# Patient Record
Sex: Female | Born: 1965 | Race: White | Hispanic: No | Marital: Married | Smoking: Current every day smoker
Health system: Southern US, Community
[De-identification: ages and names within clinical notes are randomized; demographics above are authoritative.]

## PROBLEM LIST (undated history)

## (undated) DIAGNOSIS — R002 Palpitations: Secondary | ICD-10-CM

## (undated) DIAGNOSIS — F430 Acute stress reaction: Secondary | ICD-10-CM

## (undated) DIAGNOSIS — Z8619 Personal history of other infectious and parasitic diseases: Secondary | ICD-10-CM

## (undated) DIAGNOSIS — R0989 Other specified symptoms and signs involving the circulatory and respiratory systems: Secondary | ICD-10-CM

## (undated) DIAGNOSIS — I10 Essential (primary) hypertension: Secondary | ICD-10-CM

## (undated) HISTORY — DX: Palpitations: R00.2

## (undated) HISTORY — PX: APPENDECTOMY: SHX54

## (undated) HISTORY — DX: Personal history of other infectious and parasitic diseases: Z86.19

## (undated) HISTORY — DX: Other specified symptoms and signs involving the circulatory and respiratory systems: R09.89

## (undated) HISTORY — DX: Acute stress reaction: F43.0

## (undated) HISTORY — PX: TONSILLECTOMY: SUR1361

## (undated) HISTORY — DX: Essential (primary) hypertension: I10

---

## 2005-03-20 ENCOUNTER — Other Ambulatory Visit: Admission: RE | Admit: 2005-03-20 | Discharge: 2005-03-20 | Payer: Self-pay | Admitting: Obstetrics and Gynecology

## 2008-11-26 ENCOUNTER — Emergency Department (HOSPITAL_COMMUNITY): Admission: EM | Admit: 2008-11-26 | Discharge: 2008-11-27 | Payer: Self-pay | Admitting: Emergency Medicine

## 2010-10-17 LAB — BASIC METABOLIC PANEL
BUN: 6 mg/dL (ref 6–23)
Creatinine, Ser: 0.63 mg/dL (ref 0.4–1.2)
GFR calc non Af Amer: >60 mL/min (ref >60)

## 2010-10-17 LAB — URINALYSIS, ROUTINE W REFLEX MICROSCOPIC
Glucose, UA: NEGATIVE mg/dL
Nitrite: NEGATIVE
Protein, ur: NEGATIVE mg/dL

## 2010-10-17 LAB — DIFFERENTIAL
Eosinophils Absolute: 0.1 10*3/uL (ref 0.0–0.7)
Lymphocytes Relative: 23 % (ref 12–46)
Lymphs Abs: 1.6 10*3/uL (ref 0.7–4.0)
Neutrophils Relative %: 68 % (ref 43–77)

## 2010-10-17 LAB — CBC
Platelets: 214 10*3/uL (ref 150–400)
WBC: 7.1 10*3/uL (ref 4.0–10.5)

## 2010-10-17 LAB — POCT CARDIAC MARKERS
Troponin i, poc: <0.05 ng/mL (ref 0.00–0.09)
Troponin i, poc: <0.05 ng/mL (ref 0.00–0.09)

## 2010-11-21 NOTE — Consult Note (Signed)
NAMEALECEA, TREGO NO.:  [id number]   MEDICAL RECORD NO.:  [id number]          PATIENT TYPE:  EMS   LOCATION:  [facility]                         FACILITY:  [facility]   PHYSICIAN:  Vania Rea, M.D. DATE OF BIRTH:  03/14/1966   DATE OF CONSULTATION:  11/27/2008  DATE OF DISCHARGE:                                 CONSULTATION   PRIMARY CARE PHYSICIAN:  Unassigned.   REQUESTING PHYSICIAN:  Lorre Nick, M.D., ER physician.   REASON FOR CONSULTATION:  Chest pain.   IMPRESSION:  1. Atypical chest pain, aggravated by rest, relieved with activity.      Not aggravated by excessive work in the garden.  2. Tobacco abuse.   RECOMMENDATIONS:  1. Follow up with cardiology service on Monday.  Avoid extreme      exertion until then.  2. Protonix daily p.r.n.  3. Discontinue tobacco use.   HISTORY OF PRESENT ILLNESS:  This is a 45 year old Caucasian lady who  denies any significant past medical history, who reports that for the  past 2 days, she has been having a tight pain around her chest, which is  crushing and unremitting, associated with occasional palpitations and  shortness of breath.  No diaphoresis.  She says the pain seems to lessen  when she gets involved in vigorous physical activity and is aggravated  by resting.  She does a lot of digging in the garden, and that seems to  relieve the pain.  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  The pain is  also associated with tingling in both arms.  Today was associated with  shooting pain going up into her neck, and she decided to call EMS.  They  advised her to take aspirin, and when they arrived, they gave her  sublingual nitroglycerin, which she said relieved the pain and  discomfort somewhat, but it is still present.   In the emergency room, the patient has had 2 sets of cardiac enzymes  drawn, and despite the 2 days of pain, cardiac enzymes  are completely  normal with a undetectable CK-MB and undetectable troponins.  Her EKG is  completely normal.  Patient is anxious to be discharged home.   PAST MEDICAL HISTORY:  Nil.   MEDICATIONS:  None.   ALLERGIES:  PENICILLIN, KEFLEX, and Z-PAK all cause swelling, itching  and hives.  She says there is another medication that causes a similar  problem, but she cannot remember the name of it.   SOCIAL HISTORY:  She lives at home with a 81-year-old son and her  husband.  She denies alcohol or illicit drug use.  She does smoke a half  pack per day.   FAMILY HISTORY:  Father had a heart attack in his late 90s.  Her  brother, who is a very heavy smoker and is also diabetic, had a heart  attack and is status post stenting twice.  She has another brother with  diabetes.   REVIEW OF SYSTEMS:  Other than noted above, a  10-point review of systems  is unremarkable.   PHYSICAL EXAMINATION:  A very pleasant and healthy looking Caucasian  lady sitting up in the stretcher in no acute distress.  VITALS:  Temperature 98.4, pulse 68, respirations 16, blood pressure  102/59.  She is saturating at 99% on room air.  Pupils are round and equal.  Mucous membranes are pink and anicteric.  She is not dehydrated.  No cervical lymphadenopathy or thyromegaly.  No  jugular venous distention.  CHEST:  Clear to auscultation bilaterally.  She has no reproducible cardiac tenderness.  CARDIOVASCULAR:  Regular rhythm.  No murmur heard.  ABDOMEN:  Soft and nontender.  No masses.  EXTREMITIES:  Without edema.  She has 2+ dorsalis pedis and posterior  tibial pulses bilaterally.  CENTRAL NERVOUS SYSTEM:  Cranial nerves II-XII are grossly intact.  She  has no focal neurologic deficit.   LABS:  CBC is reviewed and is completely normal with a hemoglobin of  13.7, platelets 214, white count 7.1.  Normal differential.  Her serum  chemistry is likewise completely normal with a creatinine of 3.8, BUN 6,  and  creatinine of 6.3, calcium 9.  Her cardiac enzymes on both sets, the  myoglobin in the 30s.  CK-MB and troponin are undetectable.   EKG shows a normal sinus rhythm with a rate of 68 and no ST segment  changes.   Chest x-ray shows hyperaerated lungs but normal cardiac size and shape.      Vania Rea, M.D.  Electronically Signed     LC/MEDQ  D:  11/27/2008  T:  11/27/2008  Job:  [id number]   cc:   [facility]

## 2011-11-29 ENCOUNTER — Encounter (HOSPITAL_COMMUNITY): Payer: Self-pay

## 2011-11-29 ENCOUNTER — Observation Stay (HOSPITAL_COMMUNITY)
Admission: EM | Admit: 2011-11-29 | Discharge: 2011-11-30 | Disposition: A | Discharge status: Home / Self Care | Payer: Self-pay | Attending: Emergency Medicine | Admitting: Emergency Medicine

## 2011-11-29 ENCOUNTER — Emergency Department (HOSPITAL_COMMUNITY): Payer: Self-pay

## 2011-11-29 DIAGNOSIS — R079 Chest pain, unspecified: Principal | ICD-10-CM | POA: Insufficient documentation

## 2011-11-29 DIAGNOSIS — R002 Palpitations: Secondary | ICD-10-CM | POA: Insufficient documentation

## 2011-11-29 DIAGNOSIS — R42 Dizziness and giddiness: Secondary | ICD-10-CM | POA: Insufficient documentation

## 2011-11-29 DIAGNOSIS — R0989 Other specified symptoms and signs involving the circulatory and respiratory systems: Secondary | ICD-10-CM | POA: Insufficient documentation

## 2011-11-29 DIAGNOSIS — R0609 Other forms of dyspnea: Secondary | ICD-10-CM | POA: Insufficient documentation

## 2011-11-29 LAB — BASIC METABOLIC PANEL
CO2: 22 mEq/L (ref 19–32)
Chloride: 105 mEq/L (ref 96–112)
Glucose, Bld: 94 mg/dL (ref 70–99)
Potassium: 3.9 mEq/L (ref 3.5–5.1)
Sodium: 139 mEq/L (ref 135–145)

## 2011-11-29 LAB — TROPONIN I
Troponin I: <0.3 ng/mL (ref <0.30)
Troponin I: <0.3 ng/mL (ref <0.30)

## 2011-11-29 LAB — RAPID URINE DRUG SCREEN, HOSP PERFORMED
Amphetamines: NOT DETECTED
Benzodiazepines: NOT DETECTED
Cocaine: NOT DETECTED
Opiates: NOT DETECTED

## 2011-11-29 LAB — CBC
Hemoglobin: 13.7 g/dL (ref 12.0–15.0)
MCH: 31.6 pg (ref 26.0–34.0)
RBC: 4.33 MIL/uL (ref 3.87–5.11)
WBC: 6.9 10*3/uL (ref 4.0–10.5)

## 2011-11-29 MED ORDER — MORPHINE SULFATE 4 MG/ML IJ SOLN
4.0000 mg | Freq: Once | INTRAMUSCULAR | Status: AC
  Administered 2011-11-29: 4 mg via INTRAVENOUS
  Filled 2011-11-29: qty 1

## 2011-11-29 MED ORDER — MORPHINE SULFATE 2 MG/ML IJ SOLN
2.0000 mg | Freq: Once | INTRAMUSCULAR | Status: AC
  Administered 2011-11-29: 2 mg via INTRAVENOUS
  Filled 2011-11-29: qty 1

## 2011-11-29 MED ORDER — NITROGLYCERIN 2 % TD OINT
1.0000 [in_us] | TOPICAL_OINTMENT | Freq: Once | TRANSDERMAL | Status: AC
  Administered 2011-11-29: 1 [in_us] via TOPICAL
  Filled 2011-11-29: qty 1

## 2011-11-29 MED ORDER — ASPIRIN 325 MG PO TABS
325.0000 mg | ORAL_TABLET | Freq: Once | ORAL | Status: AC
  Administered 2011-11-29: 325 mg via ORAL
  Filled 2011-11-29: qty 1

## 2011-11-29 MED ORDER — SODIUM CHLORIDE 0.9 % IV BOLUS (SEPSIS)
1000.0000 mL | Freq: Once | INTRAVENOUS | Status: AC
  Administered 2011-11-29: 1000 mL via INTRAVENOUS

## 2011-11-29 NOTE — ED Provider Notes (Signed)
I saw and evaluated the patient, reviewed the resident's note and I agree with the findings and plan.   Cyndra Numbers, MD 11/29/11 1640

## 2011-11-29 NOTE — ED Notes (Signed)
MD at bedside. 

## 2011-11-29 NOTE — ED Notes (Signed)
Regular Diet tray ordered to CDU 10

## 2011-11-29 NOTE — ED Notes (Addendum)
Patient states she was sitting in the car this morning and her heart started to race and she felt dizzy with SOB and a metal taste in her mouth. Patient states episodes like this x 6 months. This episode has been intermittent x one week. Patient placed on monitor and 2L oxygen with sats of 98%. EKG captured on arrival to [facility].

## 2011-11-29 NOTE — ED Provider Notes (Signed)
7:37 PM- Pt with on and off chest pain for 1 week with palpations who has a very significant family history of cardiac disease has been placed in the CDU for chest pain protocol to have a CT coronary angio done in the morning. I have discussed the patients chest xray, EKG and laboratory results with patient. I have also informed her what she can expect for tomorrow. She is still having intermittent  Discomfort but it is very mild. She is in NAD and has been told she can push the call buttons to ask questions anytime.  10:02 PM- patient is having some mild chest pain and pressure again at 2/10. No ekg changes noted on monitor. Morphine 4mg  ordered.  12:17 AM-  End of shift. Dr. Weldon Inches has been made aware that patient is on CPP and to have an Ct angio coronary in the morning.  Dorthula Matas, PA 11/30/11 [phone]

## 2011-11-29 NOTE — ED Provider Notes (Signed)
CDU Admission Note  Patient is a 46 year old female with strong family history for coronary artery disease who presented with chest pain today. Patient currently chest pain-free. Patient is criteria for observation today will be made to CDU protocol likely for coronary CTA.  CV: RRR, no m/r/g Pulm: CTAB, no c/w/r  Veronica Numbers, MD 11/29/11 1640

## 2011-11-29 NOTE — ED Notes (Signed)
Pt was brought in by ambulance with complain of on and off chest tightness with shortness of breath, dizziness radiating to the lt shoulder and lt jaw. Pt was given 4 baby ASA and 1 NTG SL.

## 2011-11-29 NOTE — ED Notes (Signed)
Per EDP patient may eat and drink after second troponin is drawn and results returned.

## 2011-11-29 NOTE — ED Provider Notes (Signed)
History     CSN: [id number]  Arrival date & time 11/29/11  1237   First MD Initiated Contact with Patient 11/29/11 1244      Chief Complaint  Patient presents with  . Chest Pain    (Consider location/radiation/quality/duration/timing/severity/associated sxs/prior treatment) HPI Comments: Intermittent palpitations, lightheadedness, chest pressure, dyspnea today.  Worse with exertion.  Now feeling better.  Has had similar symptoms in the past but not recently.  Good PO intake and otherwise doing well.   Patient is a 46 y.o. female presenting with chest pain. The history is provided by the patient.  Chest Pain The chest pain began less than 1 hour ago. Duration of episode(s) is 4 hours. Chest pain occurs constantly. The chest pain is unchanged. Associated with: worse when she gets up and walk. The severity of the pain is moderate. The quality of the pain is described as aching. Radiates to: left jaw. Primary symptoms include shortness of breath, palpitations and dizziness. Pertinent negatives for primary symptoms include no fever, no syncope, no abdominal pain, no nausea and no vomiting.  The palpitations also occurred with dizziness and shortness of breath. The palpitations did not occur with syncope.  Dizziness does not occur with nausea or vomiting.  She tried nothing for the symptoms.     History reviewed. No pertinent past medical history.  Past Surgical History  Procedure Date  . Appendectomy   . Cesarean section   . Cesarean section 2008    History reviewed. No pertinent family history.  History  Substance Use Topics  . Smoking status: Current Some Day Smoker  . Smokeless tobacco: Not on file  . Alcohol Use: Yes     Social maybe 2-3 drinks per year.     OB History    Grav Para Term Preterm Abortions TAB SAB Ect Mult Living                  Review of Systems  Constitutional: Negative for fever and activity change.  HENT: Negative for congestion.   Eyes:  Negative for visual disturbance.  Respiratory: Positive for shortness of breath. Negative for chest tightness.   Cardiovascular: Positive for chest pain and palpitations. Negative for leg swelling and syncope.  Gastrointestinal: Negative for nausea, vomiting and abdominal pain.  Genitourinary: Negative for dysuria.  Skin: Negative for rash.  Neurological: Positive for dizziness. Negative for syncope.  Psychiatric/Behavioral: Negative for behavioral problems.    Allergies  Azithromycin and Keflex  Home Medications   Current Outpatient Rx  Name Route Sig Dispense Refill  . IRON PO Oral Take 20 mLs by mouth daily as needed. Feeling tired & sluggish    . MAGNESIUM PO Oral Take 20 mLs by mouth daily as needed. When feels tired & sluggish      BP 95/60  Pulse 65  Temp(Src) 97.9 F (36.6 C) (Oral)  Resp 16  Ht 5\' 3"  (1.6 m)  Wt 115 lb (52.164 kg)  BMI 20.37 kg/m2  SpO2 98%  LMP 11/22/2011  Physical Exam  Constitutional: She is oriented to person, place, and time. She appears well-developed and well-nourished.  HENT:  Head: Normocephalic and atraumatic.  Eyes: Conjunctivae and EOM are normal. Pupils are equal, round, and reactive to light. No scleral icterus.  Neck: Normal range of motion. Neck supple.  Cardiovascular: Normal rate and regular rhythm.  Exam reveals no gallop and no friction rub.   No murmur heard. Pulmonary/Chest: Effort normal and breath sounds normal. No respiratory distress. She has  no wheezes. She has no rales. She exhibits no tenderness.  Abdominal: Soft. She exhibits no distension and no mass. There is no tenderness. There is no rebound and no guarding.  Musculoskeletal: Normal range of motion.  Neurological: She is alert and oriented to person, place, and time. She has normal reflexes. No cranial nerve deficit.  Skin: Skin is warm and dry. No rash noted.  Psychiatric: She has a normal mood and affect. Her behavior is normal. Judgment and thought content  normal.    ED Course  Procedures (including critical care time)   Date: 11/29/2011  Rate: 65  Rhythm: normal sinus rhythm  QRS Axis: normal  Intervals: normal  ST/T Wave abnormalities: normal  Conduction Disutrbances:none  Narrative Interpretation:   Old EKG Reviewed: unchanged     Labs Reviewed  TROPONIN I  CBC  BASIC METABOLIC PANEL  URINE RAPID DRUG SCREEN (HOSP PERFORMED)  POCT PREGNANCY, URINE  TSH  T4, FREE   Dg Chest Port 1 View  11/29/2011  *RADIOLOGY REPORT*  Clinical Data: Chest pain, dizziness.  PORTABLE CHEST - 1 VIEW  Comparison: 11/26/2008  Findings: Heart and mediastinal contours are within normal limits. No focal opacities or effusions.  No acute bony abnormality.  IMPRESSION: No active cardiopulmonary disease.  Original Report Authenticated By: Cyndie Chime, M.D.     1. Lightheaded   2. Palpitations   3. Chest pain       MDM  Intermittent palpitations, lightheadedness, chest pressure, dyspnea today.  Worse with exertion.  Now feeling better.  Has had similar symptoms in the past but not recently.  Good PO intake and otherwise doing well.  VSS and well appearing.  EKG, labs, CXR unconcerning.  Hx not suggestive of PE.  Some concern for ACS.  ASA given.  Pain free in ED.  Will place in CDU obs with plan to CTA coronaries in the am.        Army Chaco, MD 11/29/11 1525

## 2011-11-30 ENCOUNTER — Observation Stay (HOSPITAL_COMMUNITY)
Admit: 2011-11-30 | Discharge: 2011-11-30 | Disposition: A | Discharge status: Home / Self Care | Payer: Self-pay | Attending: Emergency Medicine | Admitting: Emergency Medicine

## 2011-11-30 LAB — T4, FREE: Free T4: 1.22 ng/dL (ref 0.80–1.80)

## 2011-11-30 LAB — TSH: TSH: 0.932 u[IU]/mL (ref 0.350–4.500)

## 2011-11-30 MED ORDER — METOPROLOL TARTRATE 1 MG/ML IV SOLN
5.0000 mg | Freq: Once | INTRAVENOUS | Status: AC
  Administered 2011-11-30: 5 mg via INTRAVENOUS
  Filled 2011-11-30: qty 5

## 2011-11-30 MED ORDER — IOHEXOL 350 MG/ML SOLN: 80.0000 mL | Freq: Once | INTRAVENOUS | Status: AC | PRN

## 2011-11-30 MED ORDER — METOPROLOL TARTRATE 1 MG/ML IV SOLN
INTRAVENOUS | Status: AC
  Filled 2011-11-30: qty 10

## 2011-11-30 MED ORDER — NITROGLYCERIN 0.4 MG SL SUBL
SUBLINGUAL_TABLET | SUBLINGUAL | Status: AC
  Filled 2011-11-30: qty 25

## 2011-11-30 MED ORDER — METOPROLOL TARTRATE 25 MG PO TABS: 50.0000 mg | ORAL_TABLET | Freq: Once | ORAL | Status: DC

## 2011-11-30 MED ORDER — SODIUM CHLORIDE 0.9 % IV BOLUS (SEPSIS)
500.0000 mL | Freq: Once | INTRAVENOUS | Status: AC
  Administered 2011-11-30: 1000 mL via INTRAVENOUS

## 2011-11-30 MED ORDER — METOPROLOL TARTRATE 1 MG/ML IV SOLN
5.0000 mg | Freq: Once | INTRAVENOUS | Status: AC
  Administered 2011-11-30: 5 mg via INTRAVENOUS

## 2011-11-30 MED ORDER — NITROGLYCERIN 0.4 MG SL SUBL
0.4000 mg | SUBLINGUAL_TABLET | Freq: Once | SUBLINGUAL | Status: AC
  Administered 2011-11-30: 0.4 mg via SUBLINGUAL

## 2011-11-30 NOTE — ED Provider Notes (Signed)
Care assumed at shift change.  The coronary ct returned okay.  She will be discharged to home with follow up with Dr. Daleen Squibb.  To return prn if worsens.  Geoffery Lyons, MD 11/30/11 [phone]

## 2011-11-30 NOTE — Discharge Instructions (Signed)
Chest Pain (Nonspecific) It is often hard to give a specific diagnosis for the cause of chest pain. There is always a chance that your pain could be related to something serious, such as a heart attack or a blood clot in the lungs. You need to follow up with your caregiver for further evaluation. CAUSES   Heartburn.   Pneumonia or bronchitis.   Anxiety or stress.   Inflammation around your heart (pericarditis) or lung (pleuritis or pleurisy).   A blood clot in the lung.   A collapsed lung (pneumothorax). It can develop suddenly on its own (spontaneous pneumothorax) or from injury (trauma) to the chest.   Shingles infection (herpes zoster virus).  The chest wall is composed of bones, muscles, and cartilage. Any of these can be the source of the pain.  The bones can be bruised by injury.   The muscles or cartilage can be strained by coughing or overwork.   The cartilage can be affected by inflammation and become sore (costochondritis).  DIAGNOSIS  Lab tests or other studies, such as X-rays, electrocardiography, stress testing, or cardiac imaging, may be needed to find the cause of your pain.  TREATMENT   Treatment depends on what may be causing your chest pain. Treatment may include:   Acid blockers for heartburn.   Anti-inflammatory medicine.   Pain medicine for inflammatory conditions.   Antibiotics if an infection is present.   You may be advised to change lifestyle habits. This includes stopping smoking and avoiding alcohol, caffeine, and chocolate.   You may be advised to keep your head raised (elevated) when sleeping. This reduces the chance of acid going backward from your stomach into your esophagus.   Most of the time, nonspecific chest pain will improve within 2 to 3 days with rest and mild pain medicine.  HOME CARE INSTRUCTIONS   If antibiotics were prescribed, take your antibiotics as directed. Finish them even if you start to feel better.   For the next few  days, avoid physical activities that bring on chest pain. Continue physical activities as directed.   Do not smoke.   Avoid drinking alcohol.   Only take over-the-counter or prescription medicine for pain, discomfort, or fever as directed by your caregiver.   Follow your caregiver's suggestions for further testing if your chest pain does not go away.   Keep any follow-up appointments you made. If you do not go to an appointment, you could develop lasting (chronic) problems with pain. If there is any problem keeping an appointment, you must call to reschedule.  SEEK MEDICAL CARE IF:   You think you are having problems from the medicine you are taking. Read your medicine instructions carefully.   Your chest pain does not go away, even after treatment.   You develop a rash with blisters on your chest.  SEEK IMMEDIATE MEDICAL CARE IF:   You have increased chest pain or pain that spreads to your arm, neck, jaw, back, or abdomen.   You develop shortness of breath, an increasing cough, or you are coughing up blood.   You have severe back or abdominal pain, feel nauseous, or vomit.   You develop severe weakness, fainting, or chills.   You have a fever.  THIS IS AN EMERGENCY. Do not wait to see if the pain will go away. Get medical help at once. Call your local emergency services (911 in [place] Do not drive yourself to the hospital. MAKE SURE YOU:   Understand these instructions.     Will watch your condition.   Will get help right away if you are not doing well or get worse.  Document Released: 04/04/2005 Document Revised: 06/14/2011 Document Reviewed: 01/29/2008 Exit[employer] Patient Information 2012 [employer]. 

## 2011-11-30 NOTE — ED Notes (Signed)
BMI 21.3

## 2011-12-03 NOTE — ED Provider Notes (Signed)
Medical screening examination/treatment/procedure(s) were performed by non-physician practitioner and as supervising physician I was immediately available for consultation/collaboration.  Doug Sou, MD 12/03/11 2255

## 2011-12-10 ENCOUNTER — Ambulatory Visit (INDEPENDENT_AMBULATORY_CARE_PROVIDER_SITE_OTHER): Payer: Self-pay | Admitting: Family Medicine

## 2011-12-10 ENCOUNTER — Encounter: Payer: Self-pay | Admitting: Family Medicine

## 2011-12-10 VITALS — BP 100/74 | HR 72 | Temp 98.0°F | Ht 63.75 in | Wt 118.0 lb

## 2011-12-10 DIAGNOSIS — F430 Acute stress reaction: Secondary | ICD-10-CM

## 2011-12-10 DIAGNOSIS — R002 Palpitations: Secondary | ICD-10-CM

## 2011-12-10 DIAGNOSIS — F438 Other reactions to severe stress: Secondary | ICD-10-CM

## 2011-12-10 DIAGNOSIS — F17200 Nicotine dependence, unspecified, uncomplicated: Secondary | ICD-10-CM

## 2011-12-10 NOTE — Assessment & Plan Note (Signed)
Disc in detail risks of smoking and possible outcomes including copd, vascular/ heart disease, cancer , respiratory and sinus infections  Pt voices understanding Pt not interesting in quitting at this time

## 2011-12-10 NOTE — Progress Notes (Signed)
Subjective:    Patient ID: Veronica Rodriguez, female    DOB: April 05, 1966, 46 y.o.   MRN: [id number]  HPI Here to get est as a new pt   Was in [place] recently  For palpitations  Felt like she has a skipped beat and then rapid beat 2-5 minutes  (this has happened several times) Worse around her period  (periods are irregular)  Did not catch on EKG when happening  CT of heart - calcium score was low  Labs were ok  Told to see cardiologist -- Dr Eden Emms - on 13th   Father had heart problems -- had pacer/ defibrillator    Declines health mt at this time --will let me know    Has not had a regular doctor ? Had a gyn- pap 2 years ago (midwives in [place])   No mammogram  Had a thermography done (in past)    bmi is 20  Is a current smoker-- smokes 1/2 ppd at most  Not ready to quit yet  Has tried patches and pills  Is her stress reliever   Tetnus shot - 08- is up to date   Pneumovax -never Flu shot - not recently    Labs -- none for health mt   Patient Active Problem List  Diagnoses  . Smoker   Past Medical History  Diagnosis Date  . History of chicken pox    Past Surgical History  Procedure Date  . Appendectomy   . Cesarean section   . Cesarean section 2008   History  Substance Use Topics  . Smoking status: Current Everyday Smoker -- 0.5 packs/day for 20 years    Types: Cigarettes  . Smokeless tobacco: Never Used  . Alcohol Use: Yes     Social maybe 2-3 drinks per year.    Family History  Problem Relation Age of Onset  . Heart disease Mother   . Dementia Mother   . Alcohol abuse Mother   . Heart disease Father     stent, pacemaker  . Heart disease Brother   . Diabetes Brother   . Hyperlipidemia Brother     bypass  . Diabetes Brother    Allergies  Allergen Reactions  . Azithromycin Anaphylaxis  . Keflex (Cephalexin) Anaphylaxis   Current Outpatient Prescriptions on File Prior to Visit  Medication Sig Dispense Refill  . IRON PO Take  20 mLs by mouth daily as needed. Feeling tired & sluggish      . MAGNESIUM PO Take 20 mLs by mouth daily as needed. When feels tired & sluggish          Review of Systems Review of Systems  Constitutional: Negative for fever, appetite change, fatigue and unexpected weight change.  Eyes: Negative for pain and visual disturbance.  Respiratory: Negative for cough and shortness of breath.   Cardiovascular: Negative for cp, neg for sob on exertion or PND or orthopnea    Gastrointestinal: Negative for nausea, diarrhea and constipation.  Genitourinary: Negative for urgency and frequency.  Skin: Negative for pallor or rash   Neurological: Negative for weakness, light-headedness, numbness and headaches.  Hematological: Negative for adenopathy. Does not bruise/bleed easily.  Psychiatric/Behavioral: Negative for dysphoric mood. The patient is not nervous/anxious.         Objective:   Physical Exam  Constitutional: She appears well-developed and well-nourished. No distress.  HENT:  Head: Normocephalic and atraumatic.  Right Ear: External ear normal.  Left Ear: External ear normal.  Nose: Nose  normal.  Mouth/Throat: Oropharynx is clear and moist.  Eyes: Conjunctivae and EOM are normal. Pupils are equal, round, and reactive to light. No scleral icterus.  Neck: Normal range of motion. Neck supple. No JVD present. Carotid bruit is not present. No thyromegaly present.  Cardiovascular: Normal rate, regular rhythm, normal heart sounds and intact distal pulses.  Exam reveals no gallop.   Pulmonary/Chest: Effort normal and breath sounds normal. No respiratory distress. She has no wheezes.  Abdominal: Soft. Bowel sounds are normal. She exhibits no distension, no abdominal bruit and no mass. There is no tenderness.  Musculoskeletal: She exhibits no edema and no tenderness.  Lymphadenopathy:    She has no cervical adenopathy.  Neurological: She is alert. She has normal reflexes. No cranial nerve  deficit. She exhibits normal muscle tone. Coordination normal.  Skin: Skin is warm and dry. No rash noted. No erythema. No pallor.  Psychiatric: She has a normal mood and affect.          Assessment & Plan:

## 2011-12-10 NOTE — Patient Instructions (Signed)
See the cardiologist as planned If chest pain or palpitations worsen in meantime-call or seek care  If you want to see a counselor let me know Keep exercising and write in your journal

## 2011-12-10 NOTE — Assessment & Plan Note (Signed)
Much stress as a caregiver She declines counseling Does write in a journal/ garden/ exercise and has good insignt

## 2011-12-10 NOTE — Assessment & Plan Note (Signed)
This is ongoing - and rev hosp visit - r/o MI and also neg cardiac CT- reassuring  Will see cardiology for this -may need holter/event monitor  Rev labs - reassuring Pt under much stress-may play a role She does decline almost all health mt

## 2011-12-19 ENCOUNTER — Encounter: Payer: Self-pay | Admitting: *Deleted

## 2011-12-19 ENCOUNTER — Encounter: Payer: Self-pay | Admitting: Cardiovascular Disease

## 2011-12-20 ENCOUNTER — Ambulatory Visit (INDEPENDENT_AMBULATORY_CARE_PROVIDER_SITE_OTHER): Payer: Self-pay | Admitting: Cardiovascular Disease

## 2011-12-20 ENCOUNTER — Encounter: Payer: Self-pay | Admitting: Cardiovascular Disease

## 2011-12-20 VITALS — BP 108/68 | HR 79 | Wt 119.0 lb

## 2011-12-20 DIAGNOSIS — F17200 Nicotine dependence, unspecified, uncomplicated: Secondary | ICD-10-CM

## 2011-12-20 DIAGNOSIS — R0989 Other specified symptoms and signs involving the circulatory and respiratory systems: Secondary | ICD-10-CM

## 2011-12-20 DIAGNOSIS — R002 Palpitations: Secondary | ICD-10-CM

## 2011-12-20 MED ORDER — PROPRANOLOL HCL 10 MG PO TABS: 10.0000 mg | ORAL_TABLET | Freq: Three times a day (TID) | ORAL | Status: DC

## 2011-12-20 NOTE — Assessment & Plan Note (Addendum)
Appear benign.  F/U echo to R/O structural heart disease and event monitor  PRN Inderal

## 2011-12-20 NOTE — Assessment & Plan Note (Signed)
Counseled for less than 10 minutes on cessation. Stress keeps her from quitting.  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

## 2011-12-20 NOTE — Assessment & Plan Note (Signed)
Likely due to smoking F/U carotid duplex  ASA

## 2011-12-20 NOTE — Patient Instructions (Signed)
Your physician recommends that you schedule a follow-up appointment in: 5-6 WEEKS  AFTER ALL TEST ARE DONE  Your physician has recommended you make the following change in your medication: INDERAL 10 MG  AS NEEDED FOR PALPITATIONS Your physician has requested that you have a carotid duplex. This test is an ultrasound of the carotid arteries in your neck. It looks at blood flow through these arteries that supply the brain with blood. Allow one hour for this exam. There are no restrictions or special instructions. DX BRUIT  Your physician has requested that you have an echocardiogram. Echocardiography is a painless test that uses sound waves to create images of your heart. It provides your doctor with information about the size and shape of your heart and how well your heart's chambers and valves are working. This procedure takes approximately one hour. There are no restrictions for this procedure. DX PALPITATIONS Your physician has recommended that you wear an event monitor. Event monitors are medical devices that record the heart's electrical activity. Doctors most often [place] these monitors to diagnose arrhythmias. Arrhythmias are problems with the speed or rhythm of the heartbeat. The monitor is a small, portable device. You can wear one while you do your normal daily activities. This is usually used to diagnose what is causing palpitations/syncope (passing out). DX PALPITATIONS

## 2011-12-20 NOTE — Progress Notes (Signed)
Patient ID: Veronica Rodriguez, female   DOB: 09-16-1965, 46 y.o.   MRN: [id number] Was in [place] recently referred for palpitations.   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  All other systems reviewed and negative   General: Affect appropriate Healthy:  appears stated age HEENT: normal Neck supple with no adenopathy JVP normal right  bruits no thyromegaly Lungs clear with no wheezing and good diaphragmatic motion Heart:  S1/S2 no murmur,rub, gallop or click PMI normal Abdomen: benighn, BS positve, no tenderness, no AAA no bruit.  No HSM or HJR Distal pulses intact with no bruits No edema Neuro non-focal Skin warm and dry No muscular weakness  Medications Current Outpatient Prescriptions  Medication Sig Dispense Refill  . IRON PO Take 20 mLs by mouth daily as needed. Feeling tired & sluggish      . MAGNESIUM PO Take 20 mLs by mouth daily as needed. When feels tired & sluggish        Allergies Azithromycin and Keflex  Family History: Family History  Problem Relation Age of Onset  . Heart disease Mother   . Dementia Mother   . Alcohol abuse Mother   . Heart disease Father     stent, pacemaker  . Heart disease Brother   . Diabetes Brother   . Hyperlipidemia Brother     bypass  . Diabetes Brother     Social History: History   Social History  . Marital Status:  Married    Spouse Name: N/A    Number of Children: N/A  . Years of Education: N/A   Occupational History  . Not on file.   Social History Main Topics  . Smoking status: Current Everyday Smoker -- 0.5 packs/day for 20 years    Types: Cigarettes  . Smokeless tobacco: Never Used  . Alcohol Use: Yes     Social maybe 2-3 drinks per year.   . Drug Use: No  . Sexually Active: Not on file   Other Topics Concern  . Not on file   Social History Narrative  . No narrative on file    Electrocardiogram: 5/28  NSR rate 65 no epsilon wave and normal QT   Assessment and Plan

## 2012-01-01 ENCOUNTER — Ambulatory Visit (HOSPITAL_COMMUNITY): Payer: Self-pay | Attending: Cardiovascular Disease

## 2012-01-01 ENCOUNTER — Encounter (INDEPENDENT_AMBULATORY_CARE_PROVIDER_SITE_OTHER): Payer: Self-pay

## 2012-01-01 DIAGNOSIS — I379 Nonrheumatic pulmonary valve disorder, unspecified: Secondary | ICD-10-CM | POA: Insufficient documentation

## 2012-01-01 DIAGNOSIS — R002 Palpitations: Secondary | ICD-10-CM | POA: Insufficient documentation

## 2012-01-01 DIAGNOSIS — R0989 Other specified symptoms and signs involving the circulatory and respiratory systems: Secondary | ICD-10-CM

## 2012-01-01 DIAGNOSIS — I059 Rheumatic mitral valve disease, unspecified: Secondary | ICD-10-CM | POA: Insufficient documentation

## 2012-01-01 DIAGNOSIS — F17200 Nicotine dependence, unspecified, uncomplicated: Secondary | ICD-10-CM | POA: Insufficient documentation

## 2012-01-01 DIAGNOSIS — I079 Rheumatic tricuspid valve disease, unspecified: Secondary | ICD-10-CM | POA: Insufficient documentation

## 2012-01-01 NOTE — Progress Notes (Signed)
Echocardiogram performed.  

## 2012-01-23 ENCOUNTER — Encounter: Payer: Self-pay | Admitting: *Deleted

## 2012-01-25 ENCOUNTER — Ambulatory Visit (INDEPENDENT_AMBULATORY_CARE_PROVIDER_SITE_OTHER): Payer: Self-pay | Admitting: Cardiovascular Disease

## 2012-01-25 ENCOUNTER — Encounter: Payer: Self-pay | Admitting: Cardiovascular Disease

## 2012-01-25 VITALS — BP 115/70 | HR 76 | Ht 63.0 in | Wt 120.0 lb

## 2012-01-25 DIAGNOSIS — F17200 Nicotine dependence, unspecified, uncomplicated: Secondary | ICD-10-CM

## 2012-01-25 DIAGNOSIS — R002 Palpitations: Secondary | ICD-10-CM

## 2012-01-25 DIAGNOSIS — R0989 Other specified symptoms and signs involving the circulatory and respiratory systems: Secondary | ICD-10-CM

## 2012-01-25 NOTE — Assessment & Plan Note (Signed)
Benign no structural heart disease  PRN Inderal

## 2012-01-25 NOTE — Assessment & Plan Note (Signed)
Counseled on cessation and connection of nicotine to palpitations

## 2012-01-25 NOTE — Assessment & Plan Note (Signed)
Not heard on exam today  Duplex normal

## 2012-01-25 NOTE — Progress Notes (Signed)
Patient ID: Veronica Rodriguez, female   DOB: 07/31/1965, 46 y.o.   MRN: [id number] Was in [place] recently referred for palpitations.  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  Palpitations have settled down.  Has not needed Inderal  ROS: Denies fever, malais, weight loss, blurry vision, decreased visual acuity, cough, sputum, SOB, hemoptysis, pleuritic pain, palpitaitons, heartburn, abdominal pain, melena, lower extremity edema, claudication, or rash.  All other systems reviewed and negative  General: Affect appropriate Healthy:  appears stated age HEENT: normal Neck supple with no adenopathy JVP normal no bruits no thyromegaly Lungs clear with no wheezing and good diaphragmatic motion Heart:  S1/S2 no murmur, no rub, gallop or click PMI normal Abdomen: benighn, BS positve, no tenderness, no AAA no bruit.  No HSM or HJR Distal pulses intact with no bruits No edema Neuro non-focal Skin warm and dry No muscular weakness   Current Outpatient Prescriptions  Medication Sig Dispense Refill  . IRON PO Take 20 mLs by mouth daily as needed. Feeling tired & sluggish      . MAGNESIUM PO Take 20 mLs by mouth daily as needed. When feels tired & sluggish      . propranolol (INDERAL) 10 MG tablet Take 10 mg by mouth as needed.      Marland Kitchen DISCONTD: propranolol (INDERAL) 10 MG tablet Take 1 tablet (10 mg total) by mouth 3 (three) times daily.  30 tablet  1    Allergies  Azithromycin and  Keflex  Electrocardiogram:  12/04/11  NSR normal ECG  Assessment and Plan

## 2012-01-25 NOTE — Patient Instructions (Signed)
Your physician recommends that you schedule a follow-up appointment in:  As needed  Your physician recommends that you continue on your current medications as directed. Please refer to the Current Medication list given to you today.  

## 2012-03-12 ENCOUNTER — Ambulatory Visit (INDEPENDENT_AMBULATORY_CARE_PROVIDER_SITE_OTHER): Payer: Self-pay | Admitting: Family Medicine

## 2012-03-12 ENCOUNTER — Encounter: Payer: Self-pay | Admitting: Family Medicine

## 2012-03-12 VITALS — BP 130/78 | HR 86 | Temp 97.8°F | Ht 63.0 in | Wt 121.2 lb

## 2012-03-12 DIAGNOSIS — E162 Hypoglycemia, unspecified: Secondary | ICD-10-CM | POA: Insufficient documentation

## 2012-03-12 DIAGNOSIS — R002 Palpitations: Secondary | ICD-10-CM

## 2012-03-12 DIAGNOSIS — F430 Acute stress reaction: Secondary | ICD-10-CM

## 2012-03-12 DIAGNOSIS — F438 Other reactions to severe stress: Secondary | ICD-10-CM

## 2012-03-12 LAB — COMPREHENSIVE METABOLIC PANEL
Albumin: 4.2 g/dL (ref 3.5–5.2)
Alkaline Phosphatase: 73 U/L (ref 39–117)
CO2: 26 mEq/L (ref 19–32)
Chloride: 103 mEq/L (ref 96–112)
Glucose, Bld: 78 mg/dL (ref 70–99)
Potassium: 4 mEq/L (ref 3.5–5.1)
Sodium: 136 mEq/L (ref 135–145)
Total Protein: 6.8 g/dL (ref 6.0–8.3)

## 2012-03-12 NOTE — Assessment & Plan Note (Signed)
I recommend counseling- will think about it  Disc coping tech-get back to exercise May consider trial of volarian otc herb

## 2012-03-12 NOTE — Progress Notes (Signed)
Subjective:    Patient ID: Veronica Rodriguez, female    DOB: 05-Nov-1965, 46 y.o.   MRN: [id number]  HPI  Is having heart fluttering - this has gone on for years  Last week - ate a healthy meal -- fish and rice , then 1 hour later - got dizzy and shaky -- had some sugar (soda/ chocolate)-- then felt much better  Just does not feel right or good in general  Stress level is sky high again    ? Could be premenopausal Not sleeping well right now  No hot flashes/ but having night sweats  Has gained a little weight   Hair is coming out in globs  Cardiologist cleared her of everything  Was given inderal 10- does not want to take it  (not worth it to her)  Smoking is about the same -no plans to quit yet Less than 1 ppd    Lab Results  Component Value Date   TSH 0.932 11/29/2011    Both of older brothers had DM- obese/ sedentary  Father 's side of family - lots of DM  Not exercising a lot- about to re start -- too hot   All symptoms get worse right before menses     Chemistry      Component Value Date/Time   NA 139 11/29/2011 1300   K 3.9 11/29/2011 1300   CL 105 11/29/2011 1300   CO2 22 11/29/2011 1300   BUN 9 11/29/2011 1300   CREATININE 0.72 11/29/2011 1300      Component Value Date/Time   CALCIUM 8.8 11/29/2011 1300     Lab Results  Component Value Date   WBC 6.9 11/29/2011   HGB 13.7 11/29/2011   HCT 38.2 11/29/2011   MCV 88.2 11/29/2011   PLT 226 11/29/2011   No results found for this basename: CHOL, HDL, LDLCALC, LDLDIRECT, TRIG, CHOLHDL       Patient Active Problem List  Diagnosis  . Smoker  . Palpitations  . Stress reaction  . Carotid bruit   Past Medical History  Diagnosis Date  . History of chicken pox   . Palpitations   . Stress reaction   . Carotid bruit    Past Surgical History  Procedure Date  . Appendectomy   . Cesarean section   . Cesarean section 2008   History  Substance Use Topics  . Smoking status: Current Everyday Smoker -- 0.5 packs/day  for 20 years    Types: Cigarettes  . Smokeless tobacco: Never Used  . Alcohol Use: Yes     Social maybe 2-3 drinks per year.    Family History  Problem Relation Age of Onset  . Heart disease Mother   . Dementia Mother   . Alcohol abuse Mother   . Heart disease Father     stent, pacemaker  . Heart disease Brother   . Diabetes Brother   . Hyperlipidemia Brother     bypass  . Diabetes Brother    Allergies  Allergen Reactions  . Azithromycin Anaphylaxis  . Keflex (Cephalexin) Anaphylaxis   Current Outpatient Prescriptions on File Prior to Visit  Medication Sig Dispense Refill  . IRON PO Take 20 mLs by mouth daily as needed. Feeling tired & sluggish      . MAGNESIUM PO Take 20 mLs by mouth daily as needed. When feels tired & sluggish      . propranolol (INDERAL) 10 MG tablet Take 10 mg by mouth as needed.  Review of Systems    Review of Systems  Constitutional: Negative for fever, appetite change, and unexpected weight change. pos for fatigue Eyes: Negative for pain and visual disturbance.  Respiratory: Negative for cough and shortness of breath.   Cardiovascular: Negative for cp or palpitations    Gastrointestinal: Negative for nausea, diarrhea and constipation.  Genitourinary: Negative for urgency and frequency.  Skin: Negative for pallor or rash   Neurological: Negative for weakness,  numbness and headaches.  Hematological: Negative for adenopathy. Does not bruise/bleed easily.  Psychiatric/Behavioral: Negative for dysphoric mood. The patient is nervous/anxious.  and stressed     Objective:   Physical Exam  Constitutional: She appears well-developed and well-nourished. No distress.  HENT:  Head: Normocephalic and atraumatic.  Mouth/Throat: Oropharynx is clear and moist.  Eyes: Conjunctivae and EOM are normal. Pupils are equal, round, and reactive to light. No scleral icterus.  Neck: Normal range of motion. Neck supple. No JVD present. No thyromegaly present.    Cardiovascular: Normal rate, regular rhythm, normal heart sounds and intact distal pulses.  Exam reveals no gallop.   No murmur heard. Pulmonary/Chest: Effort normal and breath sounds normal. No respiratory distress. She has no wheezes.  Abdominal: Soft. Bowel sounds are normal. She exhibits no distension. There is no tenderness.  Musculoskeletal: She exhibits no edema.  Lymphadenopathy:    She has no cervical adenopathy.  Neurological: She is alert. She has normal reflexes. No cranial nerve deficit. She exhibits normal muscle tone. Coordination normal.  Skin: Skin is warm and dry. No rash noted. No erythema. No pallor.  Psychiatric: She has a normal mood and affect.       Overall nl affect but seems tired and stressed          Assessment & Plan:

## 2012-03-12 NOTE — Patient Instructions (Addendum)
Labs today I think seeing a counselor in future , keep taking to supportive people in your life  There is an herb called volarian - helps sleep/ may help with relaxation  If no improvement - we will consider a glucose tolerance test Try to eat small frequent meals with protein and complex carbohydrate-- eat sugar only in emergency  (and always eat protein with sugar) Get rid of sweet drinks and caffeine

## 2012-03-12 NOTE — Assessment & Plan Note (Signed)
Disc need for small frequent meals with protien and avoiding simple sugars Lab today Consider 3 h GTT if she can afford it (if symptoms do not imp)

## 2012-03-12 NOTE — Assessment & Plan Note (Signed)
Neg cardiac w/u Will get back to exercise This may be stress rel Lab today

## 2012-06-16 ENCOUNTER — Telehealth: Payer: Self-pay | Admitting: *Deleted

## 2012-06-16 NOTE — Telephone Encounter (Signed)
Followed up ref: event monitor, [facility] advised Patient refused in June. TK

## 2012-09-29 ENCOUNTER — Ambulatory Visit: Payer: Self-pay | Admitting: Family Medicine

## 2012-09-29 VITALS — BP 121/74 | HR 78 | Temp 97.9°F | Resp 16 | Ht 64.0 in | Wt 122.6 lb

## 2012-09-29 DIAGNOSIS — R5383 Other fatigue: Secondary | ICD-10-CM

## 2012-09-29 DIAGNOSIS — R5381 Other malaise: Secondary | ICD-10-CM

## 2012-09-29 DIAGNOSIS — R05 Cough: Secondary | ICD-10-CM

## 2012-09-29 DIAGNOSIS — J209 Acute bronchitis, unspecified: Secondary | ICD-10-CM

## 2012-09-29 DIAGNOSIS — R059 Cough, unspecified: Secondary | ICD-10-CM

## 2012-09-29 MED ORDER — DOXYCYCLINE HYCLATE 100 MG PO TABS: 100.0000 mg | ORAL_TABLET | Freq: Two times a day (BID) | ORAL | Status: DC

## 2012-09-29 MED ORDER — HYDROCODONE-HOMATROPINE 5-1.5 MG/5ML PO SYRP: 5.0000 mL | ORAL_SOLUTION | Freq: Every evening | ORAL | Status: DC | PRN

## 2012-09-29 NOTE — Patient Instructions (Signed)

## 2012-09-29 NOTE — Progress Notes (Signed)
[facility]:  Office Visit  Chief Complaint:  Chief Complaint  Patient presents with  . Fatigue    x 2 weeks   . Cough    2 weeks w/congestion  . Fatigue    x 3 months  . Alopecia    x 3 months  . Nail Problem    x 3 months     HPI: Veronica Rodriguez is a 47 y.o. female who complains of : 1. 2 week history of sinus issues, now has moved into her chest and coughing up yellow sputum, has had fever and chills, T max 100.1 was Saturday night, Taking mucinex, tylenol, ibuprofen. Ear pressure. Facial pain and headaches. Clear sinus drainage. Has minimal SOB but no wheezing. No history of asthma or allergies, smoker 1/2 ppd , has decreased smoking since.    2. Fatigue all the time, her hair is falling out cold all the time, itches all the time. No weightloss, early menopause sxs of night sweat, had a little bit of red streaks in cough but prior to that did not. Mom has thyroid issues.No alsohol abuse.      Past Medical History  Diagnosis Date  . History of chicken pox   . Palpitations   . Stress reaction   . Carotid bruit    Past Surgical History  Procedure Laterality Date  . Appendectomy    . Cesarean section    . Cesarean section  2008   History   Social History  . Marital Status: Married    Spouse Name: N/A    Number of Children: N/A  . Years of Education: N/A   Social History Main Topics  . Smoking status: Current Every Day Smoker -- 0.50 packs/day for 20 years    Types: Cigarettes  . Smokeless tobacco: Never Used  . Alcohol Use: Yes     Comment: Social maybe 2-3 drinks per year.   . Drug Use: No  . Sexually Active: Yes   Other Topics Concern  . None   Social History Narrative  . None   Family History  Problem Relation Age of Onset  . Heart disease Mother   . Dementia Mother   . Alcohol abuse Mother   . Heart disease Father     stent, pacemaker  . Heart disease Brother   . Diabetes Brother   . Hyperlipidemia Brother     bypass  .  Diabetes Brother    Allergies  Allergen Reactions  . Azithromycin Anaphylaxis  . Keflex (Cephalexin) Anaphylaxis   Prior to Admission medications   Medication Sig Start Date End Date Taking? Authorizing Provider  ascorbic acid (VITAMIN C) 1000 MG tablet Take 1,000 mg by mouth daily.   Yes Historical Provider, MD  IRON PO Take 20 mLs by mouth daily as needed. Feeling tired & sluggish   Yes Historical Provider, MD  MAGNESIUM PO Take 20 mLs by mouth daily as needed. When feels tired & sluggish   Yes Historical Provider, MD     ROS: The patient denies fevers, chills, night sweats, unintentional weight loss, chest pain, palpitations, wheezing, dyspnea on exertion, nausea, vomiting, abdominal pain, dysuria, hematuria, melena, numbness, weakness, or tingling.   All other systems have been reviewed and were otherwise negative with the exception of those mentioned in the HPI and as above.    PHYSICAL EXAM: Filed Vitals:   09/29/12 2028  BP: 121/74  Pulse: 78  Temp: 97.9 F (36.6 C)  Resp: 16  Filed Vitals:   09/29/12 2028  Height: 5\' 4"  (1.626 m)  Weight: 122 lb 9.6 oz (55.611 kg)  SPO2   97% Body mass index is 21.03 kg/(m^2).  General: Alert, no acute distress, thin, anxious HEENT:  Normocephalic, atraumatic, oropharynx patent. No exudates, no erythema. Tm nl. + sinus tenderness Cardiovascular:  Regular rate and rhythm, no rubs murmurs or gallops.  No Carotid bruits, radial pulse intact. No pedal edema.  Respiratory: Clear to auscultation bilaterally.  No wheezes, rales, or rhonchi.  No cyanosis, no use of accessory musculature GI: No organomegaly, abdomen is soft and non-tender, positive bowel sounds.  No masses. Skin: No rashes. Neurologic: Facial musculature symmetric. Psychiatric: Patient is appropriate throughout our interaction. Lymphatic: No cervical lymphadenopathy Musculoskeletal: Gait intact.   LABS: Results for orders placed in visit on 03/12/12  COMPREHENSIVE  METABOLIC PANEL      Result Value Range   Sodium 136  135 - 145 mEq/L   Potassium 4.0  3.5 - 5.1 mEq/L   Chloride 103  96 - 112 mEq/L   CO2 26  19 - 32 mEq/L   Glucose, Bld 78  70 - 99 mg/dL   BUN 8  6 - 23 mg/dL   Creatinine, Ser 0.8  0.4 - 1.2 mg/dL   Total Bilirubin 0.8  0.3 - 1.2 mg/dL   Alkaline Phosphatase 73  39 - 117 U/L   AST 18  0 - 37 U/L   ALT 13  0 - 35 U/L   Total Protein 6.8  6.0 - 8.3 g/dL   Albumin 4.2  3.5 - 5.2 g/dL   Calcium 9.2  8.4 - 16.1 mg/dL   GFR 09.60  >45.40 mL/min  TSH      Result Value Range   TSH 0.94  0.35 - 5.50 uIU/mL     EKG/XRAY:   Primary read interpreted by Dr. Conley Rolls at [facility].   ASSESSMENT/PLAN: Encounter Diagnoses  Name Primary?  . Acute bronchitis Yes  . Other malaise and fatigue   . Cough    Rx Doxycycline We discussed risks and benefits of abx considering she has had sever reactions to Z pack and Keflex int he past. There is another medicine that she is allergic to but does not know the name.  Gave rx for Hydromet syrup if she wants to fill it. She will take OTC cough meds for now. D/w patient  Different cause fo fatigue, hairloss. She will defer getting further labs ie CBC, CMP and./or TSH until she is financially able to do labs F/u prn Declined labs and xrays Got to ER prn for SOB/CP or call if any SE with meds.     Hamilton Capri PHUONG, DO 09/29/2012 8:52 PM

## 2012-10-07 ENCOUNTER — Telehealth: Payer: Self-pay

## 2012-10-07 NOTE — Telephone Encounter (Signed)
Patient called stating that she got an allergic reaction to the Doxycycline medication. She would like to know if there is anything else she would be able to take. Please call her back @ [phone]. Thanks

## 2012-10-08 NOTE — Telephone Encounter (Signed)
It's possible that her symptoms are due to a viral rather than bacterial infection, in which case antibiotics will not be beneficial.  Given what appear to be significant antibiotic allergies, I am hesitant to prescribe another medication.  Is she febrile?  SOB?  Are any of her symptoms worsening?

## 2012-10-08 NOTE — Telephone Encounter (Signed)
Rash from Doxycycline started 2 days ago. She took 5 days of the medication, has discontinued. She does not note any improvement at all with the antibiotic. She is taking Mucinex and Benadryl. Advised her since she has sleepiness with the benadryl to take Claritin instead. Advised if she has any problems with her mouth/ throat/ or breathing she is to go to ER immediately. She agrees. Please advise.

## 2012-10-08 NOTE — Telephone Encounter (Signed)
How are her URI sx's? If they are improving she may not need another antibiotic.

## 2012-10-08 NOTE — Telephone Encounter (Signed)
She has denied any shortness of breath or fever, she is not worsening, just not improving. I called her back to advise. She will give this more time and return if she worsens.

## 2012-10-08 NOTE — Telephone Encounter (Signed)
Left message for her to call me back so I can get more information.

## 2012-10-08 NOTE — Telephone Encounter (Signed)
Spoke with pt and she states she is itching everywhere and has hives all over. She states she is not SOB or anything but wanted to see if we could change to another antibiotic. Please advise.

## 2013-02-02 IMAGING — CT CT HEART MORP W/ CTA COR W/ SCORE W/ CA W/CM &/OR W/O CM
2 of 6 series · 11 of 20 positions shown, 12 images · IV contrast (CONTRAST)
Comparison: No priors.

INDICATION: 45-year-old female with history of chest pain.

CT ANGIOGRAPHY OF THE HEART, CORONARY ARTERY, STRUCTURE, AND
MORPHOLOGY
CONTRAST:  80 ml of Omnipaque- 350.
TECHNIQUE: CT angiography of the coronary vessels was performed on
a 256 channel system using prospective ECG gating.  A scout and
noncontrast exam (for calcium scoring) were performed.  Circulation
time was measured using a test bolus.  Coronary CTA was performed
with sub mm slice collimation during portions of the cardiac cycle
after prior injection of iodinated contrast.  Imaging post
processing was performed on an independent workstation creating
multiplanar and 3-D images, and quantitative analysis of the heart
and coronary arteries.  Note that this exam targets the heart and
the chest was not imaged in its entirety.
PREMEDICATION:
Lopressor 10 mg, IV
Nitroglycerin 400 mcg, sublingual.

[Series 3: cac thin · axial · 0.49mm/px · z∈[-229,-111]mm · 3 of 96 slices shown, 4 images]
[im 1/96  vessel]
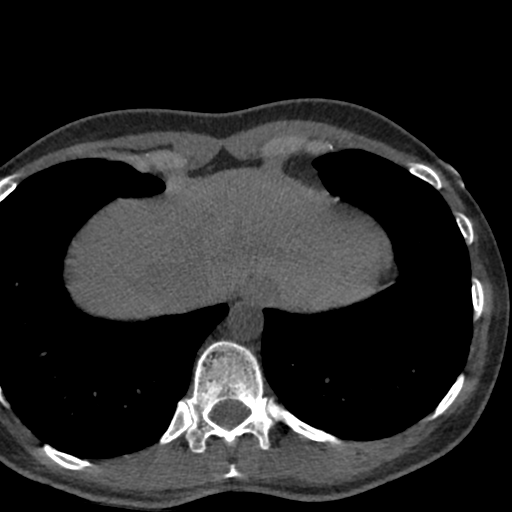
[im 1/96  lung]
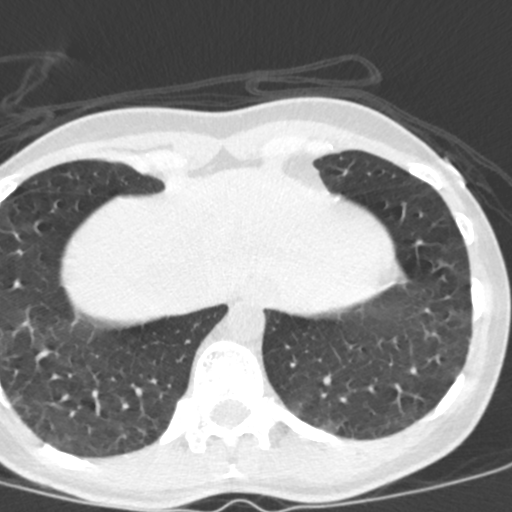
[im 48/96  vessel]
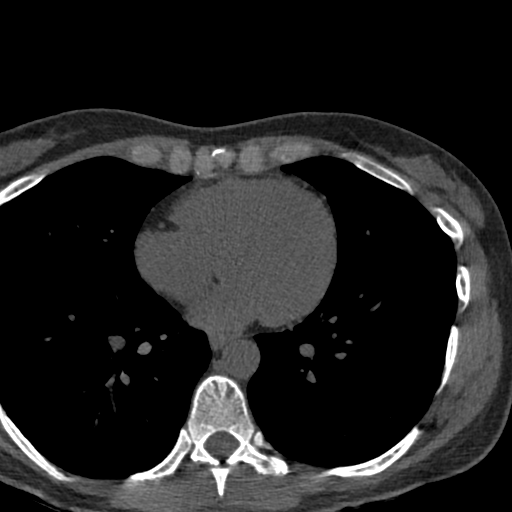
[im 96/96  vessel]
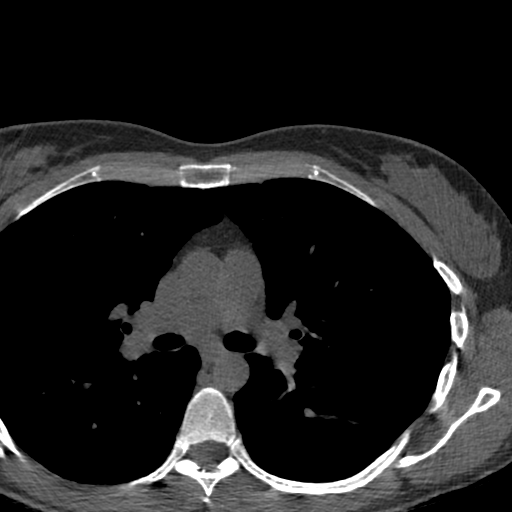

[Series 8: 78% w/o ec, 78.0% · axial · non-contrast · 0.49mm/px · z∈[-218,-119]mm · 8 of 311 slices shown]
[im 32/311  vessel]
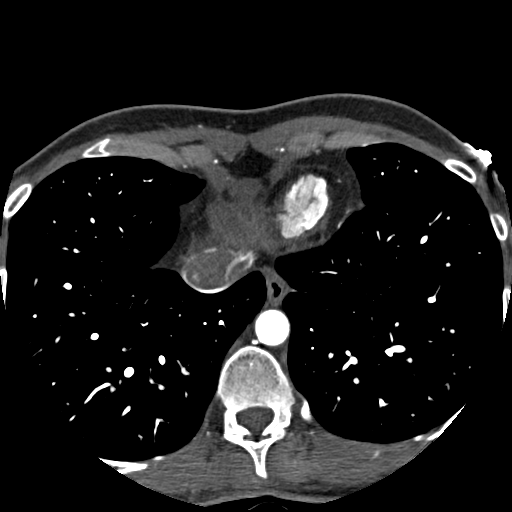
[im 63/311  vessel]
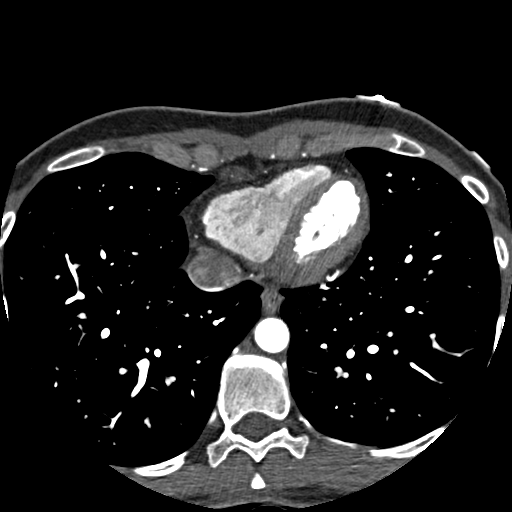
[im 94/311  vessel]
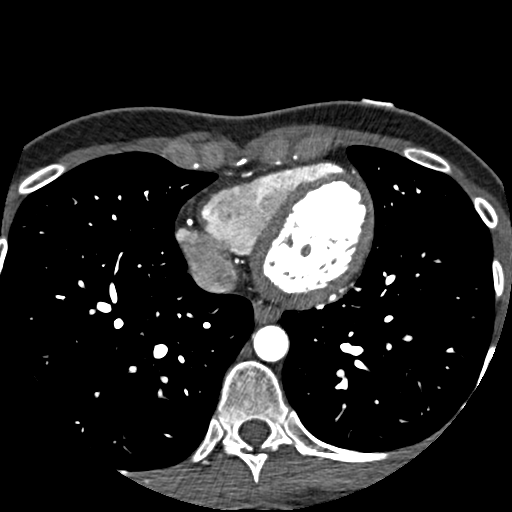
[im 125/311  vessel]
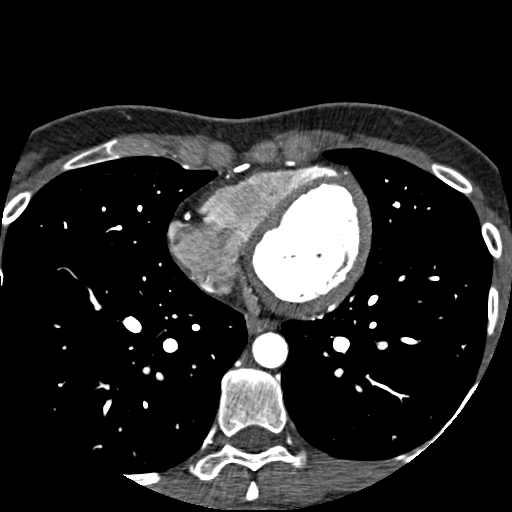
[im 187/311  vessel]
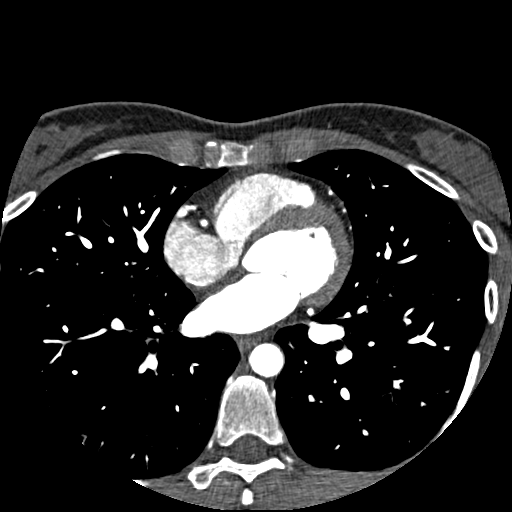
[im 218/311  vessel]
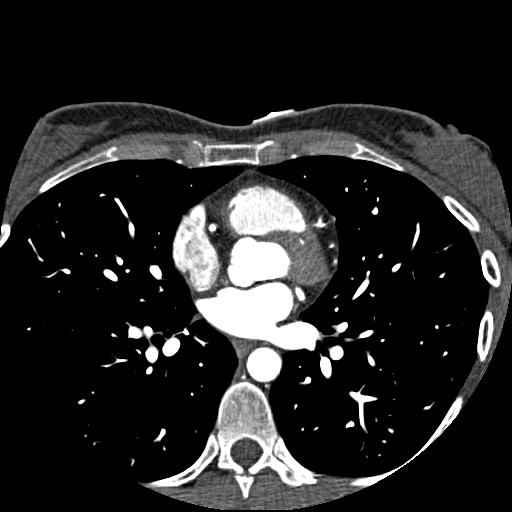
[im 249/311  vessel]
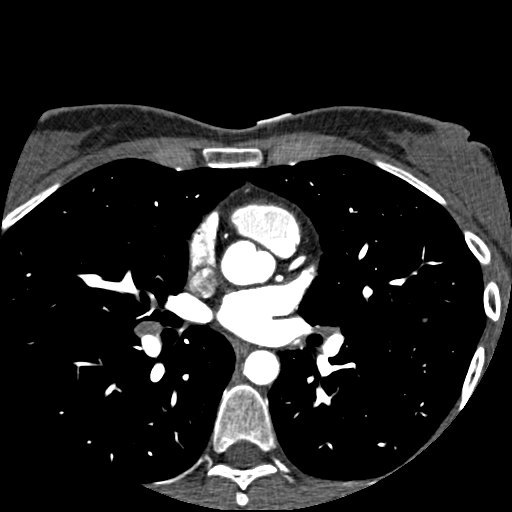
[im 280/311  vessel]
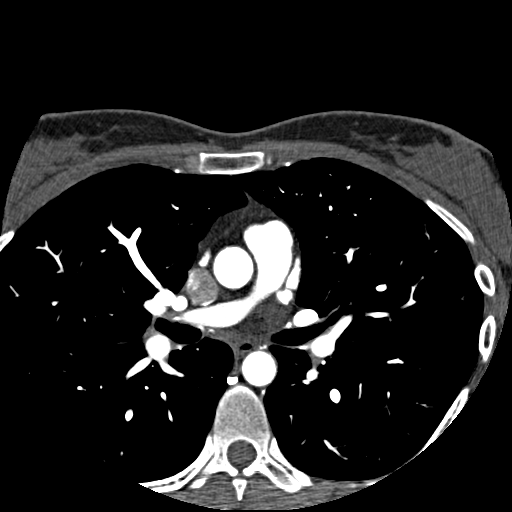

[11 of 20 positions shown; findings below may reference images not displayed]

FINDINGS: Technical quality:  Excellent.

Heart rate:  51

CORONARY ARTERIES:
Left main coronary artery:  Negative.
Left anterior descending:  Negative.
Left circumflex:  Negative.
Right coronary artery:  Negative.
Posterior descending artery:  Patent.
Dominance:  Right.

CORONARY CALCIUM:
Total Agatston Score:  0
[HOSPITAL] percentile:  N/A

AORTA AND PULMONARY MEASUREMENTS:
Aortic root (21 - 40 mm):
            21 mm  at the annulus
            25 mm  at the sinuses of Valsalva
            19 mm  at the sinotubular junction
Ascending aorta ( <  40 mm):  20 mm
Descending aorta ( <  40 mm):  16 mm
Main pulmonary artery:  ( <  30 mm):  20 mm

EXTRACARDIAC FINDINGS:
No consolidative airspace disease or pleural effusions identified
within the visualized portions of the lungs.  No definite
suspicious appearing pulmonary nodules or masses are noted in the
visualized lungs.  Visualized portions of the upper abdomen are
unremarkable. There are no aggressive appearing lytic or blastic
lesions noted in the visualized portions of the skeleton.
IMPRESSION: 1.  No evidence of significant coronary artery disease.  The
patient's total coronary artery calcium score is 0.
2.  No acute findings in the visualized thorax to account for the
patient's symptoms.
3.  Right coronary artery dominance.

[DATE] a.m..

## 2013-07-08 ENCOUNTER — Telehealth: Payer: Self-pay

## 2013-07-08 NOTE — Telephone Encounter (Signed)
It is up to her, but if she wants to consider Tamiflu he should come in.

## 2013-07-08 NOTE — Telephone Encounter (Signed)
Pt has the flu and her grandson is getting sick now. Wants to know if she should bring him in or just have him rest.

## 2013-12-14 ENCOUNTER — Ambulatory Visit (INDEPENDENT_AMBULATORY_CARE_PROVIDER_SITE_OTHER): Payer: Self-pay | Admitting: Family Medicine

## 2013-12-14 VITALS — BP 136/60 | HR 78 | Temp 98.0°F | Resp 16 | Ht 63.0 in | Wt 125.0 lb

## 2013-12-14 DIAGNOSIS — R252 Cramp and spasm: Secondary | ICD-10-CM

## 2013-12-14 DIAGNOSIS — M79609 Pain in unspecified limb: Secondary | ICD-10-CM

## 2013-12-14 DIAGNOSIS — T148XXA Other injury of unspecified body region, initial encounter: Secondary | ICD-10-CM

## 2013-12-14 DIAGNOSIS — M62831 Muscle spasm of calf: Secondary | ICD-10-CM

## 2013-12-14 DIAGNOSIS — M62838 Other muscle spasm: Secondary | ICD-10-CM

## 2013-12-14 DIAGNOSIS — M79661 Pain in right lower leg: Secondary | ICD-10-CM

## 2013-12-14 LAB — D-DIMER, QUANTITATIVE: D-Dimer, Quant: 0.27 ug/mL-FEU (ref 0.00–0.48)

## 2013-12-14 NOTE — Patient Instructions (Signed)
We will check some labwork, and let you know if other testing recommended.  I suspect your soreness is form initial spasm/muscle cramp.  Ice or heat if it feels better, gentle range of motion and stretching, and recheck in next week if not improving - sooner if worse.  If your hip pain persists or any new areas of bruising - return for recheck.   Return to the clinic or go to the nearest emergency room if any of your symptoms worsen or new symptoms occur.  Muscle Cramps and Spasms Muscle cramps and spasms occur when a muscle or muscles tighten and you have no control over this tightening (involuntary muscle contraction). They are a common problem and can develop in any muscle. The most common place is in the calf muscles of the leg. Both muscle cramps and muscle spasms are involuntary muscle contractions, but they also have differences:   Muscle cramps are sporadic and painful. They may last a few seconds to a quarter of an hour. Muscle cramps are often more forceful and last longer than muscle spasms.  Muscle spasms may or may not be painful. They may also last just a few seconds or much longer. CAUSES  It is uncommon for cramps or spasms to be due to a serious underlying problem. In many cases, the cause of cramps or spasms is unknown. Some common causes are:   Overexertion.   Overuse from repetitive motions (doing the same thing over and over).   Remaining in a certain position for a long period of time.   Improper preparation, form, or technique while performing a sport or activity.   Dehydration.   Injury.   Side effects of some medicines.   Abnormally low levels of the salts and ions in your blood (electrolytes), especially potassium and calcium. This could happen if you are taking water pills (diuretics) or you are pregnant.  Some underlying medical problems can make it more likely to develop cramps or spasms. These include, but are not limited to:   Diabetes.    Parkinson disease.   Hormone disorders, such as thyroid problems.   Alcohol abuse.   Diseases specific to muscles, joints, and bones.   Blood vessel disease where not enough blood is getting to the muscles.  HOME CARE INSTRUCTIONS   Stay well hydrated. Drink enough water and fluids to keep your urine clear or pale yellow.  It may be helpful to massage, stretch, and relax the affected muscle.  For tight or tense muscles, use a warm towel, heating pad, or hot shower water directed to the affected area.  If you are sore or have pain after a cramp or spasm, applying ice to the affected area may relieve discomfort.  Put ice in a plastic bag.  Place a towel between your skin and the bag.  Leave the ice on for 15-20 minutes, 03-04 times a day.  Medicines used to treat a known cause of cramps or spasms may help reduce their frequency or severity. Only take over-the-counter or prescription medicines as directed by your caregiver. SEEK MEDICAL CARE IF:  Your cramps or spasms get more severe, more frequent, or do not improve over time.  MAKE SURE YOU:   Understand these instructions.  Will watch your condition.  Will get help right away if you are not doing well or get worse. Document Released: 12/15/2001 Document Revised: 10/20/2012 Document Reviewed: 06/11/2012 [facility] Patient Information 2014 [place], [place].

## 2013-12-14 NOTE — Progress Notes (Signed)
Subjective:    Patient ID: Veronica Rackaula L Wormley, female    DOB: 07/27/1965, 48 y.o.   MRN: [id number]  HPI Veronica Rodriguez is a 48 y.o. female  3 nights ago - acute onset R calf pain - in the middle of the night, cramped and tight, took about 20 minutes before could place heel on floor. NKI. Noticed some pain in thigh /back of R thigh past 3 days as well, noticed small bruises on outside of hip yesterday. Feels like R leg is colder than other side. No apparent swelling. No hx of blood clots. Had palpitations in past, never caught anything on Holter monitor - extensive workup with CT and Holter monitor.  No fever, chills, feels well otherwise.  No known hx of blood clots, no hormone supplementation. 1/2 ppd smoker. No chest pains, no dyspnea. No palpitations at present.   Tx: otc magnesium, potassium, ibuprofen.    Patient Active Problem List   Diagnosis Date Noted  . Hypoglycemia 03/12/2012  . Carotid bruit 12/20/2011  . Smoker 12/10/2011  . Palpitations 12/10/2011  . Stress reaction 12/10/2011   Past Medical History  Diagnosis Date  . History of chicken pox   . Palpitations   . Stress reaction   . Carotid bruit    Past Surgical History  Procedure Laterality Date  . Appendectomy    . Cesarean section    . Cesarean section  2008   Allergies  Allergen Reactions  . Azithromycin Anaphylaxis  . Keflex [Cephalexin] Anaphylaxis  . Doxycycline Rash   Prior to Admission medications   Medication Sig Start Date End Date Taking? Authorizing Provider  ascorbic acid (VITAMIN C) 1000 MG tablet Take 1,000 mg by mouth daily.   Yes Historical Provider, MD  IRON PO Take 20 mLs by mouth daily as needed. Feeling tired & sluggish   Yes Historical Provider, MD  MAGNESIUM PO Take 20 mLs by mouth daily as needed. When feels tired & sluggish   Yes Historical Provider, MD  doxycycline (VIBRA-TABS) 100 MG tablet Take 1 tablet (100 mg total) by mouth 2 (two) times daily. 09/29/12   Thao P Le, DO    HYDROcodone-homatropine (HYCODAN) 5-1.5 MG/5ML syrup Take 5 mLs by mouth at bedtime as needed for cough. 09/29/12   Thao P Le, DO   History   Social History  . Marital Status: Married    Spouse Name: N/A    Number of Children: N/A  . Years of Education: N/A   Occupational History  . Not on file.   Social History Main Topics  . Smoking status: Current Every Day Smoker -- 0.50 packs/day for 20 years    Types: Cigarettes  . Smokeless tobacco: Never Used  . Alcohol Use: Yes     Comment: Social maybe 2-3 drinks per year.   . Drug Use: No  . Sexual Activity: Yes   Other Topics Concern  . Not on file   Social History Narrative  . No narrative on file    Review of Systems  Respiratory: Negative for shortness of breath.   Cardiovascular: Negative for chest pain and palpitations.  Musculoskeletal: Positive for arthralgias and myalgias. Negative for joint swelling.  Skin: Positive for color change (bruising on R hip.).       Objective:   Physical Exam  Vitals reviewed. Constitutional: She is oriented to person, place, and time. She appears well-developed and well-nourished. No distress.  HENT:  Head: Normocephalic and atraumatic.  Eyes: Pupils are equal, round,  and reactive to light.  Neck: Carotid bruit is not present.  Cardiovascular: Normal rate, regular rhythm, normal heart sounds and intact distal pulses.   No extrasystoles are present.  No murmur heard. Pulses:      Dorsalis pedis pulses are 1+ on the right side, and 1+ on the left side.  Faint, but equal DP pulses bilaterally.   Pulmonary/Chest: Effort normal and breath sounds normal.  Abdominal: She exhibits no pulsatile midline mass.  Musculoskeletal:       Right hip: She exhibits tenderness. She exhibits normal range of motion (no pain with ROM of hip. ) and normal strength.       Right lower leg: She exhibits tenderness (diffuse posterior mid-calf, greater than anterolateral.  no cords, no defect, no spasm. pain  in calf with  Homan's testing. ). She exhibits no bony tenderness, no swelling (calf circumfernece 33.5cm on R, 33 cm on L at 15cm below patella. ), no edema and no deformity.       Legs: Neurological: She is alert and oriented to person, place, and time.  Skin: Skin is warm and dry. Ecchymosis noted. No rash noted. She is not diaphoretic.     Psychiatric: She has a normal mood and affect. Her behavior is normal.   Filed Vitals:   12/14/13 1758  BP: 136/60  Pulse: 78  Temp: 98 F (36.7 C)  TempSrc: Oral  Resp: 16  Height: 5\' 3"  (1.6 m)  Weight: 125 lb (56.7 kg)  SpO2: 98%   Results for orders placed in visit on 12/14/13  D-DIMER, QUANTITATIVE      Result Value Ref Range   D-Dimer, Quant 0.27  0.00 - 0.48 ug/mL-FEU        Assessment & Plan:  MARKEDA NIMMER is a 48 y.o. female Right calf pain - Plan: Basic metabolic panel, D-dimer, quantitative, Magnesium  Muscle spasm of calf - Plan: Basic metabolic panel, D-dimer, quantitative, Magnesium  Muscle cramps - Plan: Basic metabolic panel, D-dimer, quantitative, Magnesium  Bruising - Plan: Basic metabolic panel, D-dimer, quantitative, Magnesium Suspected calf spasm, with resulting soreness from initial spasm.  Tobacco use, but no other known risk factors for DVT, and no appreciable calf edema/swelling.  Will check BMP, mag level, and DDimer tonight - if elevated, consider doppler.  Sx care, ice/heat, gentle rom and recheck in next week if not improving - sooner if worse.   Few faint ecchymoses on R lateral hip and troch bursa ttp - mild.  NKI, sx care, stretches, and if new areas of bruising or increased pain - rtc. May need further troch bursitis treatment if persists.    9:30 PM  Negative D Dimer reviewed.  Called pt with results. Advised to continue sx care, rtc precautions.  Understanding expressed.

## 2013-12-15 LAB — BASIC METABOLIC PANEL
BUN: 9 mg/dL (ref 6–23)
CHLORIDE: 103 meq/L (ref 96–112)
CO2: 29 meq/L (ref 19–32)
Calcium: 9.2 mg/dL (ref 8.4–10.5)
Creat: 0.8 mg/dL (ref 0.50–1.10)
Glucose, Bld: 89 mg/dL (ref 70–99)
POTASSIUM: 4.8 meq/L (ref 3.5–5.3)
SODIUM: 138 meq/L (ref 135–145)

## 2013-12-15 LAB — MAGNESIUM: MAGNESIUM: 1.9 mg/dL (ref 1.5–2.5)

## 2014-05-07 ENCOUNTER — Ambulatory Visit (INDEPENDENT_AMBULATORY_CARE_PROVIDER_SITE_OTHER): Payer: Self-pay | Admitting: Physician Assistant

## 2014-05-07 VITALS — BP 120/82 | HR 88 | Temp 97.8°F | Resp 16 | Ht 64.75 in | Wt 131.0 lb

## 2014-05-07 DIAGNOSIS — R05 Cough: Secondary | ICD-10-CM

## 2014-05-07 DIAGNOSIS — R635 Abnormal weight gain: Secondary | ICD-10-CM

## 2014-05-07 DIAGNOSIS — R059 Cough, unspecified: Secondary | ICD-10-CM

## 2014-05-07 DIAGNOSIS — R5382 Chronic fatigue, unspecified: Secondary | ICD-10-CM

## 2014-05-07 LAB — CBC WITH DIFFERENTIAL/PLATELET
BASOS ABS: 0.1 10*3/uL (ref 0.0–0.1)
Basophils Relative: 1 % (ref 0–1)
EOS PCT: 1 % (ref 0–5)
Eosinophils Absolute: 0.1 10*3/uL (ref 0.0–0.7)
HEMATOCRIT: 43.7 % (ref 36.0–46.0)
HEMOGLOBIN: 15.4 g/dL — AB (ref 12.0–15.0)
LYMPHS ABS: 1.8 10*3/uL (ref 0.7–4.0)
LYMPHS PCT: 27 % (ref 12–46)
MCH: 31.7 pg (ref 26.0–34.0)
MCHC: 35.2 g/dL (ref 30.0–36.0)
MCV: 89.9 fL (ref 78.0–100.0)
MONO ABS: 0.5 10*3/uL (ref 0.1–1.0)
MONOS PCT: 8 % (ref 3–12)
NEUTROS ABS: 4.2 10*3/uL (ref 1.7–7.7)
Neutrophils Relative %: 63 % (ref 43–77)
Platelets: 298 10*3/uL (ref 150–400)
RBC: 4.86 MIL/uL (ref 3.87–5.11)
RDW: 13.2 % (ref 11.5–15.5)
WBC: 6.6 10*3/uL (ref 4.0–10.5)

## 2014-05-07 LAB — TSH: TSH: 1.083 u[IU]/mL (ref 0.350–4.500)

## 2014-05-07 MED ORDER — BENZONATATE 100 MG PO CAPS: 100.0000 mg | ORAL_CAPSULE | Freq: Two times a day (BID) | ORAL | Status: DC | PRN

## 2014-05-07 MED ORDER — GUAIFENESIN ER 1200 MG PO TB12: 1.0000 | ORAL_TABLET | Freq: Two times a day (BID) | ORAL | Status: DC | PRN

## 2014-05-07 NOTE — Progress Notes (Signed)
Subjective:    Patient ID: Veronica Rodriguez, female    DOB: 10/11/1965, 48 y.o.   MRN: [id number]  Veronica MannsMarne Tower, MD  Chief Complaint  Patient presents with  . Cough    Semi-productive, x1 month  . Fatigue    She would like her thyroid checked   . Weight Gain   Patient Active Problem List   Diagnosis Date Noted  . Hypoglycemia 03/12/2012  . Carotid bruit 12/20/2011  . Smoker 12/10/2011  . Palpitations 12/10/2011  . Stress reaction 12/10/2011   Prior to Admission medications   Medication Sig Start Date End Date Taking? Authorizing Provider  ascorbic acid (VITAMIN C) 1000 MG tablet Take 1,000 mg by mouth daily.   Yes Historical Provider, MD  IRON PO Take 20 mLs by mouth daily as needed. Feeling tired & sluggish   Yes Historical Provider, MD  MAGNESIUM PO Take 20 mLs by mouth daily as needed. When feels tired & sluggish   Yes Historical Provider, MD   Medications, allergies, past medical history, surgical history, family history, social history and problem list reviewed and updated.  Cough    47 yof with no pertinent PMH presents with one month history cough/congestion along with fatigue and weight gain.  Cough/congestion - Quit smoking 5 weeks ago and cough started shortly afterward. Productive intermittently with clear sputum. Cough throughout day, no particular time of day. Keeps her up at night. Occasional chest discomfort with coughing spells. No exertional CP. Head/chest congestion past month. Pos rhinorrhea. Denies sore throat, ear pain, abd pain, N/V, diarrhea, night sweats, fevers, chills, or unintentional weight loss. Taking sudafed and mucinex dm which help but mucinex dm makes her feel "loopy."   Fatigue -  Past 1-2 years. Has been tired throughout the day, every day during this time. Feels that she is not sleeping well because she wakes up several times nightly. She not currently working, but is spending time with 3 elderly family members with dementia, a 46 year old son,  and helping husband run his business. She doesn't exercise. Feels that she has a healthy diet with limiting sweets and eating lots of fruit/veggies. Doesn't drink caffeine. She states that she has been told by a previous health provider that she has depression, but thinks this is wrong. She believes her tyroid may be off as her mom had thyroid probs around this age. She has also gained 15# in the past 1.5 yrs. She doesn't think she is doing anything differently so is not sure why.   Review of Systems  Respiratory: Positive for cough.       Objective:   Physical Exam  Constitutional: She is oriented to person, place, and time. She appears well-developed and well-nourished.  Non-toxic appearance. She does not have a sickly appearance. She does not appear ill. No distress.  BP 120/82  Pulse 88  Temp(Src) 97.8 F (36.6 C) (Oral)  Resp 16  Ht 5' 4.75" (1.645 m)  Wt 131 lb (59.421 kg)  BMI 21.96 kg/m2  SpO2 99%  LMP 04/26/2014   HENT:  Head: Normocephalic and atraumatic.  Right Ear: Hearing, external ear and ear canal normal. Tympanic membrane is not erythematous. No middle ear effusion.  Left Ear: Hearing, tympanic membrane, external ear and ear canal normal. Tympanic membrane is not erythematous.  No middle ear effusion.  Nose: Mucosal edema and rhinorrhea present. Right sinus exhibits no maxillary sinus tenderness and no frontal sinus tenderness. Left sinus exhibits maxillary sinus tenderness. Left sinus exhibits  no frontal sinus tenderness.  Mouth/Throat: Uvula is midline, oropharynx is clear and moist and mucous membranes are normal. No oropharyngeal exudate, posterior oropharyngeal edema or posterior oropharyngeal erythema.  Neck: Carotid bruit is not present. No Brudzinski's sign noted.  Cardiovascular: Normal rate, regular rhythm and normal heart sounds.  PMI is not displaced.  Exam reveals no gallop.   No murmur heard. Pulmonary/Chest: Effort normal and breath sounds normal. Not  tachypneic. No respiratory distress. She has no decreased breath sounds. She has no wheezes. She has no rhonchi. She has no rales.  Lymphadenopathy:       Head (right side): No submental, no submandibular and no tonsillar adenopathy present.       Head (left side): No submental, no submandibular and no tonsillar adenopathy present.    She has no cervical adenopathy.  Neurological: She is alert and oriented to person, place, and time.  Psychiatric: She has a normal mood and affect. Her speech is normal.  PHQ 2 screen: Negative (0)       Assessment & Plan:   [age] yof with no pertinent PMH presents with one month history cough/congestion along with fatigue and weight gain.  Chronic fatigue - Plan: CBC with Differential, TSH --CBC, TSH today. No CMP as BMP normal 4 months ago --Rec Vit B and D supplements from pharmacy. Didn't draw labs today due to lack of insurance --30 minute walk daily --Rec counselor if she is interested --Declines trazodone to help sleep at night  Cough - Plan: benzonatate (TESSALON) 100 MG capsule, Guaifenesin (MUCINEX MAXIMUM STRENGTH) 1200 MG TB12 --Mucinex --Tessalon --Most likely secondary to clearing mucus since quitting smoking 5 wks ago, lung sounds clear today --Denies fever, chills, unintentional wt loss, night sweats --RTC one month if cough persists for poss cxr  Weight gain --TSH --Encouraged daily walk  Veronica Lopesodd M. Mahalia Dykes, PA-C Physician Assistant-Certified [facility]  05/07/2014 10:21 AM

## 2014-05-07 NOTE — Progress Notes (Signed)
I was directly involved with the patient's care and agree with the physical, diagnosis and treatment plan.  

## 2014-05-07 NOTE — Patient Instructions (Addendum)
Your cough is most likely due to your lungs clearing a lot of mucus since you stopped smoking 5 weeks ago. Again, congratulations on quitting!!! I've sent in to the pharmacy for tessalon and mucinex. Please take as prescribed. If your cough does not resolve in 3 weeks, please return to clinic. You have A LOT going on right now! If you feel like talking to someone might help you to get through everything, we could refer you to a counselor which might help you a lot. Please let us know if this is something you're interested in.  Please go to the pharmacy and pick up a Vit B and a Vit D supplement. You can take up to 2000 units of Vit D a day.  Please return to clinic with any other issues.  Take a 30 minute walk outdoors most days of the week!

## 2014-06-07 ENCOUNTER — Encounter (HOSPITAL_COMMUNITY): Payer: Self-pay | Admitting: *Deleted

## 2014-06-07 ENCOUNTER — Emergency Department (HOSPITAL_COMMUNITY)
Admission: EM | Admit: 2014-06-07 | Discharge: 2014-06-07 | Disposition: A | Discharge status: Home / Self Care | Payer: Self-pay | Attending: Emergency Medicine | Admitting: Emergency Medicine

## 2014-06-07 ENCOUNTER — Emergency Department (HOSPITAL_COMMUNITY): Payer: Self-pay

## 2014-06-07 DIAGNOSIS — Z79899 Other long term (current) drug therapy: Secondary | ICD-10-CM | POA: Insufficient documentation

## 2014-06-07 DIAGNOSIS — R002 Palpitations: Secondary | ICD-10-CM | POA: Insufficient documentation

## 2014-06-07 DIAGNOSIS — R079 Chest pain, unspecified: Secondary | ICD-10-CM | POA: Insufficient documentation

## 2014-06-07 DIAGNOSIS — R42 Dizziness and giddiness: Secondary | ICD-10-CM | POA: Insufficient documentation

## 2014-06-07 DIAGNOSIS — R0602 Shortness of breath: Secondary | ICD-10-CM | POA: Insufficient documentation

## 2014-06-07 DIAGNOSIS — Z87891 Personal history of nicotine dependence: Secondary | ICD-10-CM | POA: Insufficient documentation

## 2014-06-07 DIAGNOSIS — Z8619 Personal history of other infectious and parasitic diseases: Secondary | ICD-10-CM | POA: Insufficient documentation

## 2014-06-07 LAB — CBC
HCT: 38.4 % (ref 36.0–46.0)
Hemoglobin: 13.2 g/dL (ref 12.0–15.0)
MCH: 29.8 pg (ref 26.0–34.0)
MCHC: 34.4 g/dL (ref 30.0–36.0)
MCV: 86.7 fL (ref 78.0–100.0)
Platelets: 251 10*3/uL (ref 150–400)
RBC: 4.43 MIL/uL (ref 3.87–5.11)
RDW: 12.4 % (ref 11.5–15.5)
WBC: 6.7 10*3/uL (ref 4.0–10.5)

## 2014-06-07 LAB — BASIC METABOLIC PANEL
Anion gap: 14 (ref 5–15)
BUN: 10 mg/dL (ref 6–23)
CO2: 22 mEq/L (ref 19–32)
Calcium: 9.3 mg/dL (ref 8.4–10.5)
Chloride: 104 mEq/L (ref 96–112)
Creatinine, Ser: 0.77 mg/dL (ref 0.50–1.10)
GFR calc Af Amer: >90 mL/min (ref >90)
GFR calc non Af Amer: >90 mL/min (ref >90)
Glucose, Bld: 98 mg/dL (ref 70–99)
Potassium: 4 mEq/L (ref 3.7–5.3)
Sodium: 140 mEq/L (ref 137–147)

## 2014-06-07 LAB — I-STAT TROPONIN, ED: Troponin i, poc: 0 ng/mL (ref 0.00–0.08)

## 2014-06-07 NOTE — ED Provider Notes (Signed)
CSN: [id number]     Arrival date & time 06/07/14  1403 History   First MD Initiated Contact with Patient 06/07/14 1418     Chief Complaint  Patient presents with  . Palpitations  . Shortness of Breath     (Consider location/radiation/quality/duration/timing/severity/associated sxs/prior Treatment) HPI  Pt is a 48yo female with hx of palpitations with previous "normal workup," stress reaction and carotid bruit with carotid US WNL, presenting to ED with c/o palpitations and feeling "lightheaded" around 1:30PM after she bent down at work to pick up plastic bags.  Pt states she also felt jittery. Symptoms lasted for a few minutes, resolved PTA, however, pt still c/o mild centralized chest soreness, does not radiate, 2/10 at worst.  Denies diaphoresis, nausea, vomiting or SOB. Denies syncope or LOC.  No medication taken PTA.  Pt reports hx of palpitations 2 years ago. Has not f/u with cardiologist since then. Pt does report FH of cardiac problems with her maternal grandfather having an MI in his 2830s and a first cousin have a "cardiac event" in his 6920s.  Pt denies personal hx of CAD.  Pt is not on any medications for blood pressure or cholesterol.    Past Medical History  Diagnosis Date  . History of chicken pox   . Palpitations     Worked up by cardiology per pt. nothing wrong  . Stress reaction   . Carotid bruit     Carotid US wnl   Past Surgical History  Procedure Laterality Date  . Appendectomy    . Cesarean section  2008  . Tonsillectomy     Family History  Problem Relation Age of Onset  . Heart disease Mother   . Dementia Mother   . Alcohol abuse Mother   . Heart disease Father     stent, pacemaker  . Heart disease Brother   . Diabetes Brother   . Hyperlipidemia Brother     bypass  . Diabetes Brother    History  Substance Use Topics  . Smoking status: Former Smoker -- 0.50 packs/day for 20 years    Types: Cigarettes    Quit date: 04/02/2014  . Smokeless tobacco: Never  Used  . Alcohol Use: Yes     Comment: Social maybe 2-3 drinks per year.    OB History    No data available     Review of Systems  Constitutional: Negative for fever and chills.  Respiratory: Negative for cough and shortness of breath.   Cardiovascular: Positive for chest pain and palpitations. Negative for leg swelling.  Gastrointestinal: Negative for nausea, vomiting, abdominal pain, diarrhea and constipation.  Neurological: Positive for light-headedness. Negative for seizures, syncope, weakness, numbness and headaches.  All other systems reviewed and are negative.     Allergies  Azithromycin; Keflex; and Doxycycline  Home Medications   Prior to Admission medications   Medication Sig Start Date End Date Taking? Authorizing Provider  ascorbic acid (VITAMIN C) 1000 MG tablet Take 1,000 mg by mouth daily.   Yes Historical Provider, MD  benzonatate (TESSALON) 100 MG capsule Take 1 capsule (100 mg total) by mouth 2 (two) times daily as needed for cough. 05/07/14  Yes Todd McVeigh, PA  Guaifenesin (MUCINEX MAXIMUM STRENGTH) 1200 MG TB12 Take 1 tablet (1,200 mg total) by mouth every 12 (twelve) hours as needed. 05/07/14  Yes Todd McVeigh, PA  IRON PO Take 20 mLs by mouth daily as needed. Feeling tired & sluggish   Yes Historical Provider, MD  MAGNESIUM  PO Take 20 mLs by mouth daily as needed. When feels tired & sluggish   Yes Historical Provider, MD  pseudoephedrine (SUDAFED) 30 MG tablet Take 30 mg by mouth every 4 (four) hours as needed for congestion.   Yes Historical Provider, MD   BP 104/72 mmHg  Pulse 76  Temp(Src) 98.1 F (36.7 C) (Oral)  Resp 17  SpO2 97%  LMP 05/24/2014 Physical Exam  Constitutional: She is oriented to person, place, and time. She appears well-developed and well-nourished. No distress.  HENT:  Head: Normocephalic and atraumatic.  Eyes: Conjunctivae and EOM are normal. Pupils are equal, round, and reactive to light. No scleral icterus.  Neck: Normal  range of motion. Neck supple.  Cardiovascular: Normal rate, regular rhythm and normal heart sounds.   Regular rate and rhythm  Pulmonary/Chest: Effort normal and breath sounds normal. No respiratory distress. She has no wheezes. She has no rales. She exhibits no tenderness.  No respiratory distress, able to speak in full sentences w/o difficulty. Lungs: CTAB  Abdominal: Soft. Bowel sounds are normal. She exhibits no distension and no mass. There is no tenderness. There is no rebound and no guarding.  Musculoskeletal: Normal range of motion.  Neurological: She is alert and oriented to person, place, and time. She has normal strength. No cranial nerve deficit or sensory deficit. Coordination and gait normal. GCS eye subscore is 4. GCS verbal subscore is 5. GCS motor subscore is 6.  Skin: Skin is warm and dry. She is not diaphoretic.  Nursing note and vitals reviewed.   ED Course  Procedures (including critical care time) Labs Review Labs Reviewed  BASIC METABOLIC PANEL  CBC  I-STAT TROPOININ, ED    Imaging Review Dg Chest Port 1 View  06/07/2014   CLINICAL DATA:  Rapid heart rate with palpitations and shortness of breath.  EXAM: PORTABLE CHEST - 1 VIEW  COMPARISON:  11/29/2011  FINDINGS: Lungs are clear. Heart and mediastinum are within normal limits. The trachea is midline. No acute bone abnormality.  IMPRESSION: No acute chest findings.   Electronically Signed   By: Richarda OverlieAdam  Henn M.D.   On: 06/07/2014 14:57     EKG Interpretation   Date/Time:  Monday June 07 2014 14:14:01 EST Ventricular Rate:  88 PR Interval:  147 QRS Duration: 78 QT Interval:  367 QTC Calculation: 444 R Axis:   80 Text Interpretation:  Sinus rhythm Low voltage, precordial leads No  significant change since last tracing 30 Nov 2011 Confirmed by KNAPP   MD-I, IVA ([id number]) on 06/07/2014 2:22:42 PM      MDM   Final diagnoses:  Shortness of breath  Palpitations    Pt is a 48yo female with hx  palpitations and FH of CAD but no personal hx of CAD, presenting to ED with c/o palpitations and lightheadedness after bending down to pick up plastic bags at work.  No LOC or syncope.  CP 2/10 in ED. No medication PTA.  EKG: NSR Labs: CBC, BMP, istat troponin: WNL CXR: no acute chest findings. Pt is low risk based off HEART score for major cardiac event.   Discussed pt with Dr. Devoria AlbeIva Knapp who also examined pt.  Not concerned for emergent process taking place at this time including but not limited to ACS, PE, pneumothorax, SAH.  Pt is safe to be discharged home to f/u with PCP and possible cardiology referral for cardiac event monitor if symptoms recur. Return precautions provided. Pt verbalized understanding and agreement with tx plan.  Junius Finner, PA-C 06/07/14 1552  Ward Givens, MD 06/07/14 [phone]

## 2014-06-07 NOTE — ED Notes (Signed)
MD at bedside. 

## 2014-06-07 NOTE — ED Notes (Signed)
Per EMS pt was at work, she bent down to pick up plastic bags and almost fell out.  Pt felt as if she was having palpitations and dizzy.  She also felt jittery.  No LOC, Syncope, changes in vision, or N/V/D. NAD at this time. Pt Ax4.

## 2014-06-07 NOTE — ED Notes (Signed)
PT ambulated 100+ feet on pulse ox. Steady gait. Pt denies any dizziness or light-headedness or anything abnormal during ambulation. Pulse ox read between 98-100% and pulse rate 80-90bpm throughout ambulation. Pt returned to bed.

## 2014-06-07 NOTE — ED Provider Notes (Signed)
Patient reports she was at work today and she bent over to pick up some bags and she had acute onset of palpitations that only lasted a few minutes. She states she felt like her heart was fluttering. During the episode she states she was shaking and felt like she was going to pass out. She denies chest pain or diaphoresis. She reports she has intermittent episodes in the past. She was evaluated by cardiology and had a CT done to evaluate her coronary arteries in her calcium score was a 0. There is a strong family history coronary artery disease and high cholesterol that is familial. She states she was evaluated by [facility] cardiologist.  PCP Dr. Milinda Antisower with [facility]  Patient is alert and cooperative in no distress. She is in normal sinus rhythm on the monitor.  Medical screening examination/treatment/procedure(s) were conducted as a shared visit with non-physician practitioner(s) and myself.  I personally evaluated the patient during the encounter.   EKG Interpretation   Date/Time:  Monday June 07 2014 14:14:01 EST Ventricular Rate:  88 PR Interval:  147 QRS Duration: 78 QT Interval:  367 QTC Calculation: 444 R Axis:   80 Text Interpretation:  Sinus rhythm Low voltage, precordial leads No  significant change since last tracing 30 Nov 2011 Confirmed by [facility]   MD-I, Mikesha Migliaccio ([id number]) on 06/07/2014 2:22:42 PM       Devoria AlbeIva Amoria Mclees, MD, Franz DellFACEP   Aubria Vanecek L Gwendolen Hewlett, MD 06/07/14 [phone]

## 2014-06-09 ENCOUNTER — Ambulatory Visit (INDEPENDENT_AMBULATORY_CARE_PROVIDER_SITE_OTHER): Payer: Self-pay | Admitting: Cardiovascular Disease

## 2014-06-09 ENCOUNTER — Encounter: Payer: Self-pay | Admitting: Cardiovascular Disease

## 2014-06-09 ENCOUNTER — Encounter: Payer: Self-pay | Admitting: *Deleted

## 2014-06-09 VITALS — BP 130/80 | HR 98 | Ht 64.0 in | Wt 130.8 lb

## 2014-06-09 DIAGNOSIS — F17200 Nicotine dependence, unspecified, uncomplicated: Secondary | ICD-10-CM

## 2014-06-09 DIAGNOSIS — Z720 Tobacco use: Secondary | ICD-10-CM

## 2014-06-09 DIAGNOSIS — R002 Palpitations: Secondary | ICD-10-CM

## 2014-06-09 DIAGNOSIS — R0989 Other specified symptoms and signs involving the circulatory and respiratory systems: Secondary | ICD-10-CM

## 2014-06-09 MED ORDER — PROPRANOLOL HCL 10 MG PO TABS: 10.0000 mg | ORAL_TABLET | Freq: Every day | ORAL | Status: DC | PRN

## 2014-06-09 NOTE — Assessment & Plan Note (Signed)
bening PRN inderal called in Monitor if insurance and symptoms indicate

## 2014-06-09 NOTE — Patient Instructions (Signed)
Your physician recommends that you schedule a follow-up appointment in:   3 MONTHS [facility]   [facility] Your physician has recommended you make the following change in your medication:  MAY  TAKE PROPANOLOL  10 MG  DAILY AS NEEDED FOR  PALPITATIONS

## 2014-06-09 NOTE — Assessment & Plan Note (Signed)
Counseled for less than 10 minutes Has been using patch for 4 weeks encouraged her to continue and decrease to 7 mg dose next week

## 2014-06-09 NOTE — Progress Notes (Signed)
Patient ID: Veronica Rodriguez, female   DOB: 07/10/1965, 48 y.o.   MRN: [id number] Was in [place] recently referred for palpitations.  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 Palpitations have settled down. Has not filled script for inderal Gets feeling like she will have palpitations Sounds like anxiety  New job at [employer]    ROS: Denies fever, malais, weight loss, blurry vision, decreased visual acuity, cough, sputum, SOB, hemoptysis, pleuritic pain, palpitaitons, heartburn, abdominal pain, melena, lower extremity edema, claudication, or rash.  All other systems reviewed and negative  General: Affect appropriate Healthy:  appears stated age HEENT: normal Neck supple with no adenopathy JVP normal right bruits no thyromegaly Lungs clear with no wheezing and good diaphragmatic motion Heart:  S1/S2 no murmur, no rub, gallop or click PMI normal Abdomen: benighn, BS positve, no tenderness, no AAA no bruit.  No HSM or HJR Distal pulses intact with no bruits No edema Neuro non-focal Skin warm and dry No muscular weakness   Current Outpatient Prescriptions  Medication Sig Dispense Refill  . ascorbic acid (VITAMIN C) 1000 MG tablet Take 1,000 mg by mouth daily.    Marland Kitchen. MAGNESIUM PO Take 20 mLs by mouth daily as needed. When feels tired & sluggish    . pseudoephedrine (SUDAFED) 30 MG tablet Take 30 mg by mouth every 4 (four) hours as needed for congestion.    . benzonatate (TESSALON) 100 MG  capsule Take 1 capsule (100 mg total) by mouth 2 (two) times daily as needed for cough. (Patient not taking: Reported on 06/09/2014) 20 capsule 0  . Guaifenesin (MUCINEX MAXIMUM STRENGTH) 1200 MG TB12 Take 1 tablet (1,200 mg total) by mouth every 12 (twelve) hours as needed. (Patient not taking: Reported on 06/09/2014) 14 tablet 1  . IRON PO Take 20 mLs by mouth daily as needed. Feeling tired & sluggish     No current facility-administered medications for this visit.    Allergies  Azithromycin; Keflex; and Doxycycline  Electrocardiogram:  06/08/14  SR rate 88 normal   Assessment and Plan

## 2014-06-09 NOTE — Assessment & Plan Note (Signed)
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

## 2014-06-11 ENCOUNTER — Ambulatory Visit: Payer: Self-pay | Admitting: Family Medicine

## 2014-06-14 ENCOUNTER — Telehealth: Payer: Self-pay | Admitting: Family Medicine

## 2014-06-14 NOTE — Telephone Encounter (Signed)
aware

## 2014-06-14 NOTE — Telephone Encounter (Signed)
Spoke to pt 06/14/14, stated she is unable to make hospital follow up appointment at this time due to her son being sick with strep and working 60 hrs a week. Pt states she will call back to schedule appt.

## 2014-07-14 ENCOUNTER — Telehealth: Payer: Self-pay | Admitting: Cardiovascular Disease

## 2014-07-14 MED ORDER — METOPROLOL TARTRATE 25 MG PO TABS: 25.0000 mg | ORAL_TABLET | Freq: Two times a day (BID) | ORAL | Status: DC

## 2014-07-14 NOTE — Telephone Encounter (Signed)
Pt states the on 06/09/13 Dr Eden EmmsNishan prescribed  inderal 10 mg for palpitations PRN pt  has been taking it once a day for a month. For the last 3 days, pt has been experience heart fluttering constantly; has also chest pain, and  SOB. Pt can come after 3:00 PM if she can be seen today.  Corine ShelterLuke Kilroy PA aware , he recommends to D/C Inderal and start Metoprolol 25 mg twice a day. Pt is aware. A  prescription for Metoprolol 25 mg twice a day for 30 days supply and refills send [facility] to [facility] [place]. Pt is aware.

## 2014-07-14 NOTE — Telephone Encounter (Signed)
New message     Pt c/o medication issue: 1. Name of Medication: Inderal 2. How are you currently taking this medication (dosage and times per day)? 10mg  daily 3. Are you having a reaction (difficulty breathing--STAT)? no 4. What is your medication issue? Heart is still "going crazy" not as bad as before but still crazy.  She is having constant palpitations.  She has been taking rx daily for almost 1 month and does not believe it is working. Please advise

## 2014-09-06 ENCOUNTER — Ambulatory Visit: Payer: Self-pay | Admitting: Cardiovascular Disease

## 2015-08-11 IMAGING — CR DG CHEST 1V PORT
1 series · 1 of 1 positions shown · non-contrast
Comparison: 11/29/2011

CLINICAL DATA: Rapid heart rate with palpitations and shortness of
breath.

EXAM:
PORTABLE CHEST - 1 VIEW

[AP]
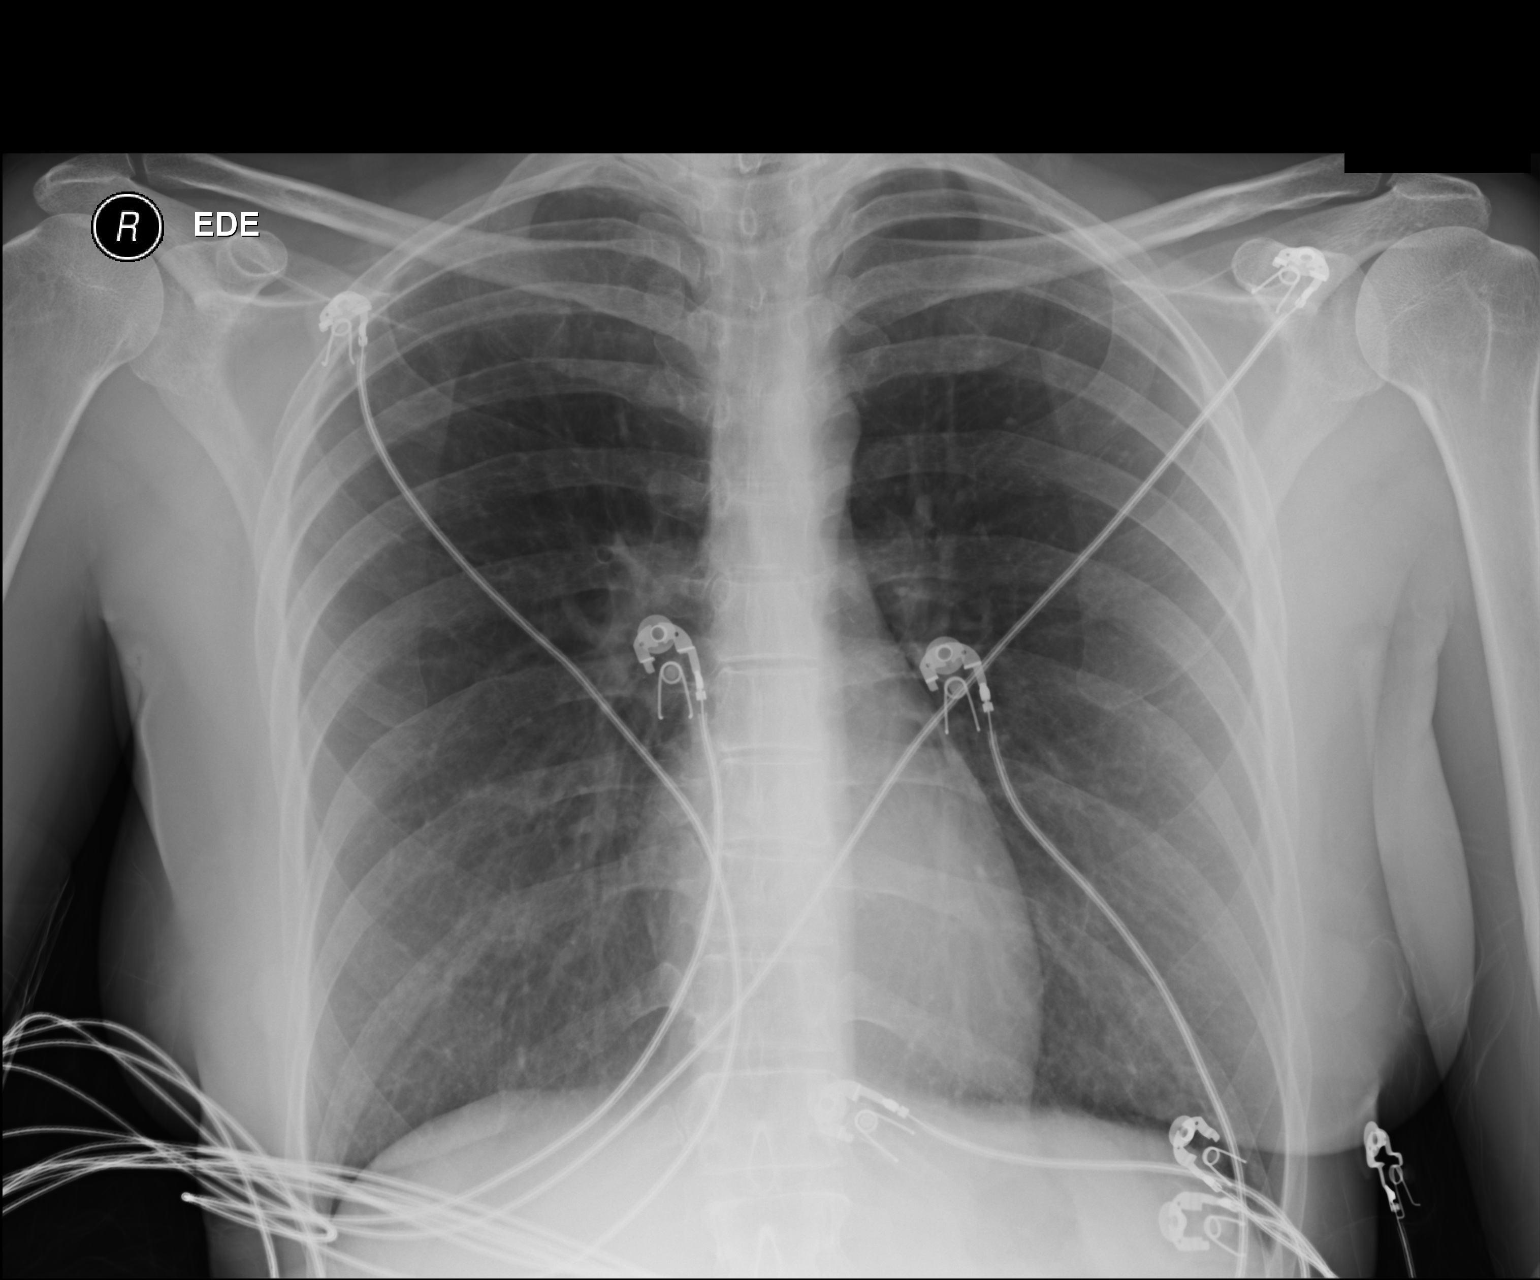

[1 of 1 positions shown; findings below may reference images not displayed]

FINDINGS: Lungs are clear. Heart and mediastinum are within normal limits. The
trachea is midline. No acute bone abnormality.
IMPRESSION: No acute chest findings.

## 2015-09-06 ENCOUNTER — Other Ambulatory Visit: Payer: Self-pay | Admitting: Physician Assistant

## 2015-09-06 ENCOUNTER — Encounter: Payer: Self-pay | Admitting: Family Medicine

## 2015-09-06 ENCOUNTER — Ambulatory Visit (INDEPENDENT_AMBULATORY_CARE_PROVIDER_SITE_OTHER): Payer: Self-pay | Admitting: Family Medicine

## 2015-09-06 VITALS — BP 132/81 | HR 103 | Temp 98.3°F | Resp 18

## 2015-09-06 DIAGNOSIS — I498 Other specified cardiac arrhythmias: Secondary | ICD-10-CM

## 2015-09-06 LAB — POCT URINALYSIS DIP (MANUAL ENTRY)
BILIRUBIN UA: NEGATIVE
Glucose, UA: NEGATIVE
Ketones, POC UA: NEGATIVE
LEUKOCYTES UA: NEGATIVE
NITRITE UA: NEGATIVE
Protein Ur, POC: NEGATIVE
RBC UA: NEGATIVE
Spec Grav, UA: 1.01
Urobilinogen, UA: 0.2
pH, UA: 7

## 2015-09-06 NOTE — Progress Notes (Signed)
09/06/2015 7:52 PM   DOB: 1966/01/20 / MRN: [id number]  SUBJECTIVE:  Veronica Rodriguez is a 50 y.o. female presenting for palpitations. States they occur daily, and recount the most recent episode occuring while in the shower, and says that her heart was pounding fast and she felt like she needed to sit down. Denies any nausea, presyncope and chest pain with these, however the episode was frightening.   She has been seen by Dr. Janice Coffin and she has cardiac history where her father had defibrillator and pacer placed. Her palpitations were never caught on ekg or telemetry; prescribed propanolol, which she never tried, echocardiogram normal, encouraged to use nicotine patches for smoking cessation and try as needed propanolol, follow up in 1 year, which would have been this past winter. She took propanolol  qd without improvement. Still complaining palpitations and switched to metoprolol  qd, 1 year supply, which would've ran out last month. Appears she's in high stress job working 60 hours a week as well as raising children. She smokes 5 cigarettes a day.   She is allergic to azithromycin; keflex; and doxycycline.   She  has a past medical history of History of chicken pox; Palpitations; Stress reaction; and Carotid bruit.    She  reports that she quit smoking about 17 months ago. Her smoking use included Cigarettes. She has a 10 pack-year smoking history. She has never used smokeless tobacco. She reports that she drinks alcohol. She reports that she does not use illicit drugs. She  reports that she currently engages in sexual activity. The patient  has past surgical history that includes Appendectomy; Cesarean section (2008); and Tonsillectomy.  Her family history includes Alcohol abuse in her mother; Dementia in her mother; Diabetes in her brother and brother; Heart disease in her brother, father, and mother; Hyperlipidemia in her brother.  Review of Systems  Constitutional: Negative for fever and  chills.  Eyes: Negative for blurred vision.  Respiratory: Negative for cough and shortness of breath.   Cardiovascular: Positive for palpitations. Negative for chest pain.  Gastrointestinal: Negative for nausea and abdominal pain.  Genitourinary: Negative for dysuria, urgency and frequency.  Musculoskeletal: Negative for myalgias.  Skin: Negative for rash.  Neurological: Negative for dizziness, tingling and headaches.  Psychiatric/Behavioral: Negative for depression. The patient is not nervous/anxious.     Problem list and medications reviewed and updated by myself where necessary, and exist elsewhere in the encounter.   OBJECTIVE:  BP 132/81 mmHg  Pulse 103  Temp(Src) 98.3 F (36.8 C) (Oral)  Resp 18  SpO2 98%  LMP 09/06/2015  Physical Exam  Constitutional: She is oriented to person, place, and time. She appears well-nourished. No distress.  Eyes: EOM are normal. Pupils are equal, round, and reactive to light.  Cardiovascular: Normal rate.   Pulmonary/Chest: Effort normal.  Abdominal: She exhibits no distension.  Neurological: She is alert and oriented to person, place, and time. No cranial nerve deficit. Gait normal.  Skin: Skin is dry. She is not diaphoretic.  Psychiatric: She has a normal mood and affect.  Vitals reviewed.   Results for orders placed or performed in visit on 09/06/15 (from the past 72 hour(s))  POCT urinalysis dipstick     Status: None   Collection Time: 09/06/15  7:12 PM  Result Value Ref Range   Color, UA yellow yellow   Clarity, UA clear clear   Glucose, UA negative negative   Bilirubin, UA negative negative   Ketones, POC UA negative negative  Spec Grav, UA 1.010    Blood, UA negative negative   pH, UA 7.0    Protein Ur, POC negative negative   Urobilinogen, UA 0.2    Nitrite, UA Negative Negative   Leukocytes, UA Negative Negative    No results found.  ASSESSMENT AND PLAN  Ferol was seen today for shortness of breath and heart  fluttering.  Diagnoses and all orders for this visit:  Fluttering heart [facility]): Her symptoms are consistent with PSVT, however EKG is normal here.  She has had an extensive work up with Dr. Theodoro Clock and was subsequently placed on a beta blocker which she states made her sleepy.  Last EF was 55-60 in 01/01/2012.  She feels this problem is debilitating. I have advised her that a second opinion would not hurt and she is amenable to this. Will send her to Dr. Nadara Eaton for a this.  I have also advised that this may not be cardiogenic in nature, but stress may also cause these symtpoms and if Dr. Nadara Eaton feels her heart is healthy and can find no evidence of dysrhythmia she would benefit from discussing SSRI therapy.  She voiced understanding. Advised that she quit smoking.   -     EKG 12-Lead -     Cancel: POCT CBC -     POCT urinalysis dipstick -     Basic Metabolic Panel -     CBC -     Ambulatory referral to Cardiology    The patient was advised to call or return to clinic if she does not see an improvement in symptoms or to seek the care of the closest emergency department if she worsens with the above plan.   Deliah Boston, MHS, PA-C [facility] 09/06/2015 7:52 PM

## 2015-09-06 NOTE — Patient Instructions (Signed)
Please stop smoking 

## 2015-09-07 ENCOUNTER — Encounter: Payer: Self-pay | Admitting: *Deleted

## 2015-09-07 LAB — CBC
HEMATOCRIT: 41.6 % (ref 36.0–46.0)
Hemoglobin: 14.1 g/dL (ref 12.0–15.0)
MCH: 30.5 pg (ref 26.0–34.0)
MCHC: 33.9 g/dL (ref 30.0–36.0)
MCV: 90 fL (ref 78.0–100.0)
MPV: 10 fL (ref 8.6–12.4)
Platelets: 277 10*3/uL (ref 150–400)
RBC: 4.62 MIL/uL (ref 3.87–5.11)
RDW: 13.5 % (ref 11.5–15.5)
WBC: 6.1 10*3/uL (ref 4.0–10.5)

## 2015-09-07 LAB — BASIC METABOLIC PANEL
BUN: 9 mg/dL (ref 7–25)
CALCIUM: 8.9 mg/dL (ref 8.6–10.2)
CO2: 25 mmol/L (ref 20–31)
Chloride: 105 mmol/L (ref 98–110)
Creat: 0.87 mg/dL (ref 0.50–1.10)
Glucose, Bld: 95 mg/dL (ref 65–99)
Potassium: 3.9 mmol/L (ref 3.5–5.3)
Sodium: 139 mmol/L (ref 135–146)

## 2015-09-08 ENCOUNTER — Encounter: Payer: Self-pay | Admitting: *Deleted

## 2015-09-08 LAB — TSH: TSH: 1.12 m[IU]/L

## 2015-09-09 NOTE — Progress Notes (Signed)
Patient ID: Veronica Rodriguez, female   DOB: 12/27/1965, 50 y.o.   MRN: [id number] Reviewed history, documentation and ekg and agree w/ assessment and plan. Norberto SorensonEva Nuh Lipton, MD MPH

## 2015-11-10 ENCOUNTER — Emergency Department (HOSPITAL_COMMUNITY)
Admission: EM | Admit: 2015-11-10 | Discharge: 2015-11-10 | Disposition: A | Discharge status: Home / Self Care | Payer: Self-pay | Attending: Emergency Medicine | Admitting: Emergency Medicine

## 2015-11-10 ENCOUNTER — Encounter (HOSPITAL_COMMUNITY): Payer: Self-pay | Admitting: Emergency Medicine

## 2015-11-10 DIAGNOSIS — Z8659 Personal history of other mental and behavioral disorders: Secondary | ICD-10-CM | POA: Insufficient documentation

## 2015-11-10 DIAGNOSIS — Z8619 Personal history of other infectious and parasitic diseases: Secondary | ICD-10-CM | POA: Insufficient documentation

## 2015-11-10 DIAGNOSIS — R002 Palpitations: Secondary | ICD-10-CM | POA: Insufficient documentation

## 2015-11-10 DIAGNOSIS — Z87891 Personal history of nicotine dependence: Secondary | ICD-10-CM | POA: Insufficient documentation

## 2015-11-10 NOTE — ED Provider Notes (Signed)
CSN: [id number]     Arrival date & time 11/10/15  0902 History   First MD Initiated Contact with Patient 11/10/15 [phone]     Chief Complaint  Patient presents with  . Palpitations     (Consider location/radiation/quality/duration/timing/severity/associated sxs/prior Treatment) HPI   50 year old female palpitations. Onset today while in the car. Currently resolved. She has a past history of similar symptoms. Previously yes evaluations without clear etiology. No appreciable exacerbating relieving factors. Denies any chest pain with her symptoms earlier today. Denies any significant caffeine use. No hx of  thyroid dysfunction that she is aware of. No unusual leg pain or swelling.  Past Medical History  Diagnosis Date  . History of chicken pox   . Palpitations     Worked up by cardiology per pt. nothing wrong  . Stress reaction   . Carotid bruit     Carotid US wnl   Past Surgical History  Procedure Laterality Date  . Appendectomy    . Cesarean section  2008  . Tonsillectomy     Family History  Problem Relation Age of Onset  . Heart disease Mother   . Dementia Mother   . Alcohol abuse Mother   . Heart disease Father     stent, pacemaker  . Heart disease Brother   . Diabetes Brother   . Hyperlipidemia Brother     bypass  . Diabetes Brother    Social History  Substance Use Topics  . Smoking status: Former Smoker -- 0.50 packs/day for 20 years    Types: Cigarettes    Quit date: 04/02/2014  . Smokeless tobacco: Never Used  . Alcohol Use: Yes     Comment: Social maybe 2-3 drinks per year.    OB History    No data available     Review of Systems  All systems reviewed and negative, other than as noted in HPI.   Allergies  Azithromycin; Keflex; and Doxycycline  Home Medications   Prior to Admission medications   Medication Sig Start Date End Date Taking? Authorizing Provider  IRON PO Take 20 mLs by mouth daily as needed. Feeling tired & sluggish   Yes Historical  Provider, MD  MAGNESIUM PO Take 20 mLs by mouth daily as needed. When feels tired & sluggish   Yes Historical Provider, MD  metoprolol tartrate (LOPRESSOR) 25 MG tablet Take 1 tablet (25 mg total) by mouth 2 (two) times daily. Patient taking differently: Take 25 mg by mouth 2 (two) times daily as needed (palpitations).  07/14/14 11/10/15 Yes Luke K Kilroy, PA-C   BP 123/82 mmHg  Pulse 64  Temp(Src) 98.4 F (36.9 C) (Oral)  Resp 14  Ht 5\' 3"  (1.6 m)  Wt 128 lb (58.06 kg)  BMI 22.68 kg/m2  SpO2 98%  LMP 10/18/2015 Physical Exam  Constitutional: She appears well-developed and well-nourished. No distress.  HENT:  Head: Normocephalic and atraumatic.  Eyes: Conjunctivae are normal. Right eye exhibits no discharge. Left eye exhibits no discharge.  Neck: Neck supple.  Cardiovascular: Normal rate, regular rhythm and normal heart sounds.  Exam reveals no gallop and no friction rub.   No murmur heard. Pulmonary/Chest: Effort normal and breath sounds normal. No respiratory distress.  Abdominal: Soft. She exhibits no distension. There is no tenderness.  Musculoskeletal: She exhibits no edema or tenderness.  Neurological: She is alert.  Skin: Skin is warm and dry.  Psychiatric: She has a normal mood and affect. Her behavior is normal. Thought content normal.  Nursing note  and vitals reviewed.   ED Course  Procedures (including critical care time) Labs Review Labs Reviewed - No data to display  Imaging Review No results found. I have personally reviewed and evaluated these images and lab results as part of my medical decision-making.   EKG Interpretation   Date/Time:  Thursday Nov 10 2015 09:13:11 EDT Ventricular Rate:  62 PR Interval:  149 QRS Duration: 82 QT Interval:  402 QTC Calculation: 408 R Axis:   74 Text Interpretation:  Sinus rhythm Low voltage, precordial leads Confirmed  by Juleen China  MD, Jacqualynn Parco ([id number]) on 11/10/2015 9:40:07 AM      MDM   Final diagnoses:  Heart  palpitations    50 year old female with palpitations. She has a several year history of similar type symptoms without clear etiology. Symptoms currently resolved. She is in a sinus rhythm. Her EKG is fairly unremarkable. She is hemodynamically stable. At this point, I do not feel that further ED workup will be of much utility. She has when necessary metoprolol to take. She has established PCP and cardiology care. I feel she is stable for discharge. Return precautions were discussed. Outpatient follow-up otherwise.    Raeford Razor, MD 11/14/15 [phone]

## 2015-11-10 NOTE — Discharge Instructions (Signed)

## 2015-11-10 NOTE — ED Notes (Signed)
Pt is in stable condition upon d/c and ambulates from ED. 

## 2016-05-24 ENCOUNTER — Encounter: Payer: Self-pay | Admitting: Family Medicine

## 2017-04-11 ENCOUNTER — Encounter: Payer: Self-pay | Admitting: Family Medicine

## 2017-04-11 ENCOUNTER — Ambulatory Visit (INDEPENDENT_AMBULATORY_CARE_PROVIDER_SITE_OTHER): Payer: Self-pay | Admitting: Family Medicine

## 2017-04-11 VITALS — BP 126/64 | HR 76 | Temp 98.2°F | Resp 16 | Ht 63.0 in | Wt 132.2 lb

## 2017-04-11 DIAGNOSIS — R1031 Right lower quadrant pain: Secondary | ICD-10-CM

## 2017-04-11 DIAGNOSIS — N898 Other specified noninflammatory disorders of vagina: Secondary | ICD-10-CM

## 2017-04-11 LAB — POCT WET + KOH PREP
Trich by wet prep: ABSENT
Yeast by KOH: ABSENT
Yeast by wet prep: ABSENT

## 2017-04-11 NOTE — Progress Notes (Signed)
Chief Complaint  Patient presents with  . bowel swollen on right side, pt has appendix scarring    episode started on Saturday, pain level 7/10. ibuprofen last night for pain with no relief    HPI   RLQ abd pain Onset Saturday 05/06/17 She reports a history of appendectomy emergency surgery at age 51 She reports intermittent pains here and there at the area of the scar on the right side She states that she started having the pain and had difficulty with BM and nausea She states that she massaged the area and then felt gas move in her bowels She states that she stopped eating solids 2 days ago and has only been doing soups and liquids. She denies emesis She states that she has never had a bowel obstruction before She reports that last night she passed gas.  Past Medical History:  Diagnosis Date  . Carotid bruit    Carotid US wnl  . History of chicken pox   . Palpitations    Worked up by cardiology per pt. nothing wrong  . Stress reaction     Current Outpatient Prescriptions  Medication Sig Dispense Refill  . IRON PO Take 20 mLs by mouth daily as needed. Feeling tired & sluggish    . MAGNESIUM PO Take 400 mg/day by mouth daily as needed. When feels tired & sluggish     . verapamil (CALAN-SR) 120 MG CR tablet Take 120 mg by mouth at bedtime.     No current facility-administered medications for this visit.     Allergies:  Allergies  Allergen Reactions  . Azithromycin Anaphylaxis  . Keflex [Cephalexin] Anaphylaxis  . Doxycycline Rash    Past Surgical History:  Procedure Laterality Date  . APPENDECTOMY    . CESAREAN SECTION  2008  . TONSILLECTOMY      Social History   Social History  . Marital status: Married    Spouse name: N/A  . Number of children: N/A  . Years of education: N/A   Social History Main Topics  . Smoking status: Former Smoker    Packs/day: 0.50    Years: 20.00    Types: Cigarettes    Quit date: 04/02/2014  . Smokeless tobacco: Never Used    . Alcohol use Yes     Comment: Social maybe 2-3 drinks per year.   . Drug use: No  . Sexual activity: Yes   Other Topics Concern  . None   Social History Narrative  . None    ROS Review of Systems See HPI Constitution: No fevers or chills No malaise No diaphoresis Skin: No rash or itching Eyes: no blurry vision, no double vision GU: no dysuria or hematuria Neuro: no dizziness or headaches  Objective: Vitals:   04/11/17 0837  BP: 126/64  Pulse: 76  Resp: 16  Temp: 98.2 F (36.8 C)  TempSrc: Oral  SpO2: 98%  Weight: 132 lb 3.2 oz (60 kg)  Height:  (1.6 m)    Physical Exam  Constitutional: She is oriented to person, place, and time. She appears well-developed and well-nourished.  HENT:  Head: Normocephalic and atraumatic.  Cardiovascular: Normal rate, regular rhythm and normal heart sounds.   Pulmonary/Chest: Effort normal and breath sounds normal. No respiratory distress. She has no wheezes.  Neurological: She is alert and oriented to person, place, and time.  Psychiatric: She has a normal mood and affect. Her behavior is normal. Judgment and thought content normal.   Vaginal exam- chaperone present  Labia normal bilaterally without skin lesions Urethral meatus normal appearing without erythema Vagina with white, thin discharge No CMT, ovaries small and not palpable Uterus midline, nontender Palpation of the RLQ from pelvic approach without rebound or guarding  WET PREP - No yeast, trich, or clue cells  Assessment and Plan Ellyanna was seen today for bowel swollen on right side, pt has appendix scarring.  Diagnoses and all orders for this visit:  RLQ abdominal pain- discussed signs and symptoms of partial to complete bowel obstruction Pt has good insight Normal pelvic exam  Vaginal discharge -     POCT Wet + KOH Prep  Other orders -     Cancel: Flu Vaccine QUAD 36+ mos IM -     Cancel: Tdap vaccine greater than or equal to 7yo IM -     Cancel:  MM Digital Screening; Future     Darthy Manganelli A Creta Levin

## 2017-04-11 NOTE — Patient Instructions (Addendum)
IF you received an x-ray today, you will receive an invoice from [facility]. Please contact [facility] at [phone] with questions or concerns regarding your invoice.   IF you received labwork today, you will receive an invoice from [facility]. Please contact [facility] at [phone] with questions or concerns regarding your invoice.   Our billing staff will not be able to assist you with questions regarding bills from these companies.  You will be contacted with the lab results as soon as they are available. The fastest way to get your results is to activate your My Chart account. Instructions are located on the last page of this paperwork. If you have not heard from [place] regarding the results in 2 weeks, please contact this office.     Small Bowel Obstruction A small bowel obstruction is a blockage in the small bowel. The small bowel, which is also called the small intestine, is a long, slender tube that connects the stomach to the colon. When a person eats and drinks, food and fluids go from the stomach to the small bowel. This is where most of the nutrients in the food and fluids are absorbed. A small bowel obstruction will prevent food and fluids from passing through the small bowel as they normally do during digestion. The small bowel can become partially or completely blocked. This can cause symptoms such as abdominal pain, vomiting, and bloating. If this condition is not treated, it can be dangerous because the small bowel could rupture. What are the causes? Common causes of this condition include:  Scar tissue from previous surgery or radiation treatment.  Recent surgery. This may cause the movements of the bowel to slow down and cause food to block the intestine.  Hernias.  Inflammatory bowel disease (colitis).  Twisting of the bowel (volvulus).  Tumors.  A foreign body.  Slipping of a part of the bowel into another part (intussusception).  What  are the signs or symptoms? Symptoms of this condition include:  Abdominal pain. This may be dull cramps or sharp pain. It may occur in one area, or it may be present in the entire abdomen. Pain can range from mild to severe, depending on the degree of obstruction.  Nausea and vomiting. Vomit may be greenish or a yellow bile color.  Abdominal bloating.  Constipation.  Lack of passing gas.  Frequent belching.  Diarrhea. This may occur if the obstruction is partial and runny stool is able to leak around the obstruction.  How is this diagnosed? This condition may be diagnosed based on a physical exam, medical history, and X-rays of the abdomen. You may also have other tests, such as a CT scan of the abdomen and pelvis. How is this treated? Treatment for this condition depends on the cause and severity of the problem. Treatment options may include:  Bed rest along with fluids and pain medicines that are given through an IV tube inserted into one of your veins. Sometimes, this is all that is needed for the obstruction to improve.  Following a simple diet. In some cases, a clear liquid diet may be required for several days. This allows the bowel to rest.  Placement of a small tube (nasogastric tube) into the stomach. When the bowel is blocked, it usually swells up like a balloon that is filled with air and fluids. The air and fluids may be removed by suction through the nasogastric tube. This can help with pain, discomfort, and nausea. It can also help  the obstruction to clear up faster.  Surgery. This may be required if other treatments do not work. Bowel obstruction from a hernia may require early surgery and can be an emergency procedure. Surgery may also be required for scar tissue that causes frequent or severe obstructions.  Follow these instructions at home:  Get plenty of rest.  Follow instructions from your health care provider about eating restrictions. You may need to avoid  solid foods and consume only clear liquids until your condition improves.  Take over-the-counter and prescription medicines only as told by your health care provider.  Keep all follow-up visits as told by your health care provider. This is important. Contact a health care provider if:  You have a fever.  You have chills. Get help right away if:  You have increased pain or cramping.  You vomit blood.  You have uncontrolled vomiting or nausea.  You cannot drink fluids because of vomiting or pain.  You develop confusion.  You begin feeling very dry or thirsty (dehydrated).  You have severe bloating.  You feel extremely weak or you faint. This information is not intended to replace advice given to you by your health care provider. Make sure you discuss any questions you have with your health care provider. Document Released: 09/11/2005 Document Revised: 02/20/2016 Document Reviewed: 08/19/2014 Elsevier Interactive Patient Education  2017 [employer].

## 2018-09-23 ENCOUNTER — Ambulatory Visit: Payer: Self-pay | Admitting: Cardiology

## 2018-10-10 ENCOUNTER — Encounter: Payer: Self-pay | Admitting: Cardiology

## 2018-10-11 NOTE — Progress Notes (Signed)
Subjective:  Primary Physician:  Tower, Wynelle Fanny, MD  Patient ID: Veronica Rodriguez, female    DOB: 03-Nov-1965, 53 y.o.   MRN: [id number]  Chief Complaint  Patient presents with  . Palpitations  This visit type was conducted due to national recommendations for restrictions regarding the COVID-19 Pandemic (e.g. social distancing).  This format is felt to be most appropriate for this patient at this time.  All issues noted in this document were discussed and addressed.  No physical exam was performed (except for noted visual exam findings with Telehealth visits - very limited).  The patient has consented to conduct a Telehealth visit and understands insurance will be billed.   I connected with patient, on 10/13/18  by a video enabled telemedicine application and verified that I am speaking with the correct person using two identifiers.     I discussed the limitations of evaluation and management by telemedicine and the availability of in person appointments. The patient expressed understanding and agreed to proceed.   I have discussed with patient regarding the safety during COVID Pandemic and steps and precautions including social distancing with the patient.   HPI: Veronica Rodriguez  is a 53 y.o. female  who presents for a follow-up of PSVT and PVCs.  Patient has been feeling fair. No significant change in frequency of palpitations. There is no associated dizziness or near syncope. She did not have any spells of sudden rapid heartbeat since the last visit.  No history of exertional chest pain. No history of shortness of breath, orthopnea or PND.  No history of swelling on the legs. There is no history of claudication but she has leg cramps off and on since teenage.  No history of hypertension or diabetes. She has not had her cholesterol checked in many years. Pt. did not want the test done as she does not want to take any medications even if it is high. She is a current smoker, smokes 5  cigarettes per day. She has family history of premature CAD.  She c/o constipation from Verapamil, but says that I am able to manage with high fiber, prune juice and colace.  Patient usually drinks 1 cup of regular tea in the morning. afterwards, she may drink caffeine free tea. and Coca-Cola or other sodas, caffeine free. She does not take decongestants.    Past Medical History:  Diagnosis Date  . Carotid bruit    Carotid US wnl  . History of chicken pox   . Palpitations    Worked up by cardiology per pt. nothing wrong  . Stress reaction     Past Surgical History:  Procedure Laterality Date  . APPENDECTOMY    . CESAREAN SECTION  2008  . TONSILLECTOMY      Social History   Socioeconomic History  . Marital status: Married    Spouse name: Not on file  . Number of children: Not on file  . Years of education: Not on file  . Highest education level: Not on file  Occupational History  . Not on file  Social Needs  . Financial resource strain: Not on file  . Food insecurity:    Worry: Not on file    Inability: Not on file  . Transportation needs:    Medical: Not on file    Non-medical: Not on file  Tobacco Use  . Smoking status: Current Every Day Smoker    Packs/day: 0.25    Years: 20.00    Pack  years: 5.00    Types: Cigarettes    Last attempt to quit: 04/02/2014    Years since quitting: 4.5  . Smokeless tobacco: Never Used  Substance and Sexual Activity  . Alcohol use: Yes    Comment: Social maybe 2-3 drinks per year.   . Drug use: No  . Sexual activity: Yes  Lifestyle  . Physical activity:    Days per week: Not on file    Minutes per session: Not on file  . Stress: Not on file  Relationships  . Social connections:    Talks on phone: Not on file    Gets together: Not on file    Attends religious service: Not on file    Active member of club or organization: Not on file    Attends meetings of clubs or organizations: Not on file    Relationship status: Not  on file  . Intimate partner violence:    Fear of current or ex partner: Not on file    Emotionally abused: Not on file    Physically abused: Not on file    Forced sexual activity: Not on file  Other Topics Concern  . Not on file  Social History Narrative  . Not on file    Current Outpatient Medications on File Prior to Visit  Medication Sig Dispense Refill  . MAGNESIUM PO Take 400 mg/day by mouth daily as needed. When feels tired & sluggish     . verapamil (CALAN-SR) 120 MG CR tablet Take 120 mg by mouth at bedtime.     No current facility-administered medications on file prior to visit.     Review of Systems  Constitutional: Negative for fever.  HENT: Negative for nosebleeds.   Eyes: Negative for blurred vision.  Respiratory: Negative for cough.   Gastrointestinal: Negative for abdominal pain, nausea and vomiting.  Genitourinary: Negative for dysuria.  Musculoskeletal: Negative for myalgias.  Skin: Negative for itching and rash.  Neurological: Negative for dizziness and loss of consciousness.  Psychiatric/Behavioral: The patient is not nervous/anxious.        Objective:  Height _0  (1.6 m), weight 128 lb (58.1 kg). Body mass index is 22.67 kg/m.  Physical Exam  No physical exam was performed as it was a telemedicine visit. Pt. was not able to check BP at home, she has BP instrument but no batteries.  CARDIAC STUDIES:    Event monitor-09/19/15-10/18/15- sinus rhythm. PACs, PVCs. 2 short runs of PSVT consisting of 4 beats each.   Echo- 05/24/2016 1. Left ventricle cavity is normal in size. Normal global wall motion. Calculated EF 56%. 2. Mild (Grade I) mitral regurgitation. Mild calcification of the mitral valve annulus. 3. Trace tricuspid regurgitation.  Treadmill stress test [10/07/2015]: Indication: Chest pain The patient exercised according to Bruce Protocol, Total time recorded 7:35 min achieving max heart rate of 169 which was 97% of MPHR for age and 9.49  METS of work. Normal BP response. Resting ECG showing NSR IRBBB. There was no ST-T changes of ischemia with exercise stress test. No arrythmias noted. Stress terminated due to shortness of breath and THR >85% MPHR met.    Assessment & Recommendations:   1. PSVT (paroxysmal supraventricular tachycardia) ([place])  2. PVC's (premature ventricular contractions)  3. Smoker   Laboratory Exam:  Recommendation:  Patient is stable symptomatically. PVCs are benign, she does not have any other associated symptoms. She did not have any further spells of PSVT. I have advised her to continue same dose of  verapamil. I have discussed with her that if the frequency of PSVT increases, we should consider ablation. However, she vehemently refused for having ablation.  I have again advised her to have cholesterol check, but she said she does not want to do anything presently due to COVID virus epidemic. Will think about getting it after that, will call us back. I have explained her that in view of family history of premature CAD, if her cholesterol is high, she must be treated. She verbalized understanding but does not want blood test for cholesterol. She also said that I have been following strict low cholesterol diet and also calorie restriction.   She was again advised very strongly to quit smoking completely. Follow low-salt, low-cholesterol diet. She was also advised to avoid caffeine, decongestants or other stimulants.  Return for follow-up after 6 months but call us earlier if the symptoms become worse.   Despina Hick, MD, [facility] 10/13/2018, 10:38 AM [facility]. [place] Pager: [phone] Office: [phone] If no answer Cell [phone]

## 2018-10-13 ENCOUNTER — Ambulatory Visit (INDEPENDENT_AMBULATORY_CARE_PROVIDER_SITE_OTHER): Payer: Self-pay | Admitting: Cardiology

## 2018-10-13 ENCOUNTER — Other Ambulatory Visit: Payer: Self-pay

## 2018-10-13 ENCOUNTER — Encounter: Payer: Self-pay | Admitting: Cardiology

## 2018-10-13 VITALS — Ht 63.0 in | Wt 128.0 lb

## 2018-10-13 DIAGNOSIS — F17200 Nicotine dependence, unspecified, uncomplicated: Secondary | ICD-10-CM

## 2018-10-13 DIAGNOSIS — I493 Ventricular premature depolarization: Secondary | ICD-10-CM

## 2018-10-13 DIAGNOSIS — I471 Supraventricular tachycardia: Secondary | ICD-10-CM

## 2018-10-14 ENCOUNTER — Ambulatory Visit: Payer: Self-pay | Admitting: Cardiology

## 2018-12-23 ENCOUNTER — Other Ambulatory Visit: Payer: Self-pay | Admitting: Cardiology

## 2019-04-20 ENCOUNTER — Ambulatory Visit: Payer: Self-pay | Admitting: Cardiology

## 2019-05-08 ENCOUNTER — Ambulatory Visit: Payer: Self-pay

## 2019-05-08 ENCOUNTER — Other Ambulatory Visit: Payer: Self-pay

## 2019-05-08 ENCOUNTER — Ambulatory Visit (INDEPENDENT_AMBULATORY_CARE_PROVIDER_SITE_OTHER): Payer: Self-pay | Admitting: Cardiology

## 2019-05-08 ENCOUNTER — Encounter: Payer: Self-pay | Admitting: Cardiology

## 2019-05-08 VITALS — BP 114/70 | HR 77 | Temp 98.2°F | Ht 63.0 in | Wt 131.0 lb

## 2019-05-08 DIAGNOSIS — R079 Chest pain, unspecified: Secondary | ICD-10-CM

## 2019-05-08 DIAGNOSIS — I493 Ventricular premature depolarization: Secondary | ICD-10-CM

## 2019-05-08 DIAGNOSIS — I471 Supraventricular tachycardia: Secondary | ICD-10-CM

## 2019-05-08 DIAGNOSIS — F17200 Nicotine dependence, unspecified, uncomplicated: Secondary | ICD-10-CM

## 2019-05-08 DIAGNOSIS — Z13220 Encounter for screening for lipoid disorders: Secondary | ICD-10-CM

## 2019-05-08 MED ORDER — NICOTINE POLACRILEX 2 MG MT LOZG: 2.0000 mg | LOZENGE | OROMUCOSAL | 2 refills | Status: DC | PRN

## 2019-05-08 MED ORDER — ASPIRIN EC 81 MG PO TBEC: 81.0000 mg | DELAYED_RELEASE_TABLET | Freq: Every day | ORAL | 3 refills | Status: DC

## 2019-05-08 NOTE — Progress Notes (Signed)
Follow up visit  Subjective:   Veronica Rodriguez, female    DOB: June 19, 1966, 53 y.o.   MRN: [id number]   Chief Complaint  Patient presents with  . PSVT  . Follow-up    6 month     HPI  53 year old female with symptomatic PVCs, PSVT, tobacco abuse, here for follow-up.  Patient is currently under a lot of stress owing to her mother's illness and family dynamics related to that. In the last few weeks, she has experienced several episodes of chest tightness related to emotional stress, lasting for a few minutes. She has not noticed any chest pain related to physical exertion, although she does not do any regular physical exercise. During this period, she has increased her smoking to about 1/2 pack/day. She has also noticed increased frequency of her flutter symptoms.   Past Medical History:  Diagnosis Date  . Carotid bruit    Carotid US wnl  . History of chicken pox   . Palpitations    Worked up by cardiology per pt. nothing wrong  . Stress reaction      Past Surgical History:  Procedure Laterality Date  . APPENDECTOMY    . CESAREAN SECTION  2008  . TONSILLECTOMY       Social History   Socioeconomic History  . Marital status: Married    Spouse name: Not on file  . Number of children: Not on file  . Years of education: Not on file  . Highest education level: Not on file  Occupational History  . Not on file  Social Needs  . Financial resource strain: Not on file  . Food insecurity    Worry: Not on file    Inability: Not on file  . Transportation needs    Medical: Not on file    Non-medical: Not on file  Tobacco Use  . Smoking status: Current Every Day Smoker    Packs/day: 0.25    Years: 20.00    Pack years: 5.00    Types: Cigarettes    Last attempt to quit: 04/02/2014    Years since quitting: 5.1  . Smokeless tobacco: Never Used  Substance and Sexual Activity  . Alcohol use: Yes    Comment: Social maybe 2-3 drinks per year.   . Drug use: No  . Sexual  activity: Yes  Lifestyle  . Physical activity    Days per week: Not on file    Minutes per session: Not on file  . Stress: Not on file  Relationships  . Social Herbalist on phone: Not on file    Gets together: Not on file    Attends religious service: Not on file    Active member of club or organization: Not on file    Attends meetings of clubs or organizations: Not on file    Relationship status: Not on file  . Intimate partner violence    Fear of current or ex partner: Not on file    Emotionally abused: Not on file    Physically abused: Not on file    Forced sexual activity: Not on file  Other Topics Concern  . Not on file  Social History Narrative  . Not on file     Family History  Problem Relation Age of Onset  . Heart disease Mother   . Dementia Mother   . Alcohol abuse Mother   . Heart disease Father        stent, pacemaker  .  Heart disease Brother   . Diabetes Brother   . Hyperlipidemia Brother        bypass  . Diabetes Brother      Current Outpatient Medications on File Prior to Visit  Medication Sig Dispense Refill  . MAGNESIUM PO Take 400 mg/day by mouth daily as needed. When feels tired & sluggish     . verapamil (CALAN-SR) 120 MG CR tablet TAKE 1 TABLET BY MOUTH IN THE EVENING AFTER DINNER 90 tablet 2   No current facility-administered medications on file prior to visit.     Cardiovascular studies:  EKG 05/08/2019: Sinus rhythm 64 bpm. Normal EKG.   Event monitor 09/19/15-10/18/15: Sinus rhythm. PACs, PVCs. 2 short runs of PSVT consisting of 4 beats each.  Echocardiogram 05/24/2016: 1. Left ventricle cavity is normal in size. Normal global wall motion. Calculated EF 56%. 2. Mild (Grade I) mitral regurgitation. Mild calcification of the mitral valve annulus. 3. Trace tricuspid regurgitation.  Treadmill stress test [10/07/2015]:  Indication: Chest pain The patient exercised according to Bruce Protocol, Total time recorded 7:35  min achieving max heart rate of 169 which was 97% of MPHR for age and 9.49 METS of work. Normal BP response. Resting ECG showing NSR IRBBB. There was no ST-T changes of ischemia with exercise stress test. No arrythmias noted. Stress terminated due to shortness of breath and THR >85% MPHR met.   Recent labs: Not available.    Review of Systems  Constitution: Negative for decreased appetite, malaise/fatigue, weight gain and weight loss.  HENT: Negative for congestion.   Eyes: Negative for visual disturbance.  Cardiovascular: Positive for chest pain and palpitations. Negative for dyspnea on exertion, leg swelling and syncope.  Respiratory: Negative for cough.   Endocrine: Negative for cold intolerance.  Hematologic/Lymphatic: Does not bruise/bleed easily.  Skin: Negative for itching and rash.  Musculoskeletal: Negative for myalgias.  Gastrointestinal: Negative for abdominal pain, nausea and vomiting.  Genitourinary: Negative for dysuria.  Neurological: Negative for dizziness and weakness.  Psychiatric/Behavioral: The patient is nervous/anxious.   All other systems reviewed and are negative.        Vitals:   05/08/19 1129  BP: 114/70  Pulse: 77  Temp: 98.2 F (36.8 C)  SpO2: 97%    Body mass index is 23.21 kg/m. Filed Weights   05/08/19 1129  Weight: 131 lb (59.4 kg)     Objective:   Physical Exam  Constitutional: She is oriented to person, place, and time. She appears well-developed and well-nourished. No distress.  HENT:  Head: Normocephalic and atraumatic.  Eyes: Pupils are equal, round, and reactive to light. Conjunctivae are normal.  Neck: No JVD present.  Cardiovascular: Normal rate, regular rhythm and intact distal pulses.  No murmur heard. Pulmonary/Chest: Effort normal and breath sounds normal. She has no wheezes. She has no rales.  Abdominal: Soft. Bowel sounds are normal. There is no rebound.  Musculoskeletal:        General: No edema.   Lymphadenopathy:    She has no cervical adenopathy.  Neurological: She is alert and oriented to person, place, and time. No cranial nerve deficit.  Skin: Skin is warm and dry.  Psychiatric:  Tearful talking about life stressors  Nursing note and vitals reviewed.         Assessment & Recommendations:   53 year old female with symptomatic PVCs, PSVT, tobacco abuse, now with chest pain  Chest pain: Triggered by emotional stress, Given her risk factors of tobacco abuse, recommend exercise nuclear stress test.  Recommend Aspirin 81 mg unless stress test is normal. Will check lipid panel.  Palpitations: Recommend 48 hr Holter monitor. Continue verapamil 120 mg daily.   Tobacco cessation counseling:  - Currently smoking 1/2 packs/day   - Patient was informed of the dangers of tobacco abuse including stroke, cancer, and MI, as well as benefits of tobacco cessation. - Patient is willing to quit at this time. - Approximately 5 mins were spent counseling patient cessation techniques. We discussed various methods to help quit smoking, including deciding on a date to quit, joining a support group, pharmacological agents. Patient would like to use nicotine lozenges. - I will reassess her progress at the next follow-up visit    Nigel Mormon, MD [facility]. [place] Pager: [phone] Office: [phone] If no answer Cell [phone]

## 2019-05-13 ENCOUNTER — Telehealth: Payer: Self-pay

## 2019-05-13 ENCOUNTER — Other Ambulatory Visit: Payer: Self-pay | Admitting: Cardiology

## 2019-05-13 DIAGNOSIS — R079 Chest pain, unspecified: Secondary | ICD-10-CM

## 2019-05-13 MED ORDER — NITROGLYCERIN 0.4 MG SL SUBL: 0.4000 mg | SUBLINGUAL_TABLET | SUBLINGUAL | 3 refills | Status: DC | PRN

## 2019-05-13 NOTE — Telephone Encounter (Signed)
Called pt no answer, left a vm due that she has not bought in her Holter monitor back.

## 2019-05-13 NOTE — Telephone Encounter (Signed)
Sent now to [facility]

## 2019-05-14 DIAGNOSIS — R002 Palpitations: Secondary | ICD-10-CM

## 2019-05-21 NOTE — Progress Notes (Signed)
S/w patient advised her of monitor results.

## 2019-06-03 ENCOUNTER — Other Ambulatory Visit: Payer: Self-pay

## 2019-06-03 DIAGNOSIS — R079 Chest pain, unspecified: Secondary | ICD-10-CM

## 2019-06-08 ENCOUNTER — Ambulatory Visit: Payer: Self-pay

## 2019-06-08 ENCOUNTER — Other Ambulatory Visit: Payer: Self-pay

## 2019-06-17 ENCOUNTER — Encounter: Payer: Self-pay | Admitting: Cardiology

## 2019-06-17 ENCOUNTER — Telehealth (INDEPENDENT_AMBULATORY_CARE_PROVIDER_SITE_OTHER): Payer: Self-pay | Admitting: Cardiology

## 2019-06-17 ENCOUNTER — Other Ambulatory Visit: Payer: Self-pay

## 2019-06-17 VITALS — Ht 63.0 in | Wt 135.0 lb

## 2019-06-17 DIAGNOSIS — R079 Chest pain, unspecified: Secondary | ICD-10-CM

## 2019-06-17 DIAGNOSIS — I471 Supraventricular tachycardia: Secondary | ICD-10-CM

## 2019-06-17 DIAGNOSIS — Z13220 Encounter for screening for lipoid disorders: Secondary | ICD-10-CM | POA: Insufficient documentation

## 2019-06-17 NOTE — Progress Notes (Signed)
Follow up visit  Subjective:   Veronica Rodriguez, female    DOB: 1966-05-03, 53 y.o.   MRN: [id number]  I connected withthe patient on 12/9/2020by a telephone call and verified that I am speaking with the correct person using two identifiers.    I offered the patient a video enabled application for a virtual visit. Unfortunately, this could not be accomplished due to technical difficulties/lack of video enabled phone/computer. I discussed the limitations of evaluation and management by telemedicine and the availability of in person appointments. The patient expressed understanding and agreed to proceed.   This visit type was conducted due to national recommendations for restrictions regarding the COVID-19 Pandemic (e.g. social distancing). This format is felt to be most appropriate for this patient at this time. All issues noted in this document were discussed and addressed. No physical exam was performed (except for noted visual exam findings with Tele health visits). The patient has consented to conduct a Tele health visit and understands insurance will be billed.   Chief Complaint  Patient presents with  . Chest Pain  . Follow-up     HPI  53 year old female with symptomatic PVCs, PSVT, tobacco abuse, chest pain.  Unfortunately, her test is currently performed due to Covid related restrictions. Patient has continued to have stressors at home, now with her mother's illness.  She has occasional episodes of chest pain with emotional stress, but frequency and severity has significantly improved.  She has not had to take any nitroglycerin.  She walks more frequently, for about a mile, without any significant symptoms.  She continues to smoke, but is unable to quit at this time due to her stressors.  Past Medical History:  Diagnosis Date  . Carotid bruit    Carotid US wnl  . History of chicken pox   . Palpitations    Worked up by cardiology per pt. nothing wrong  . Stress reaction       Past Surgical History:  Procedure Laterality Date  . APPENDECTOMY    . CESAREAN SECTION  2008  . TONSILLECTOMY       Social History   Socioeconomic History  . Marital status: Married    Spouse name: Not on file  . Number of children: 1  . Years of education: Not on file  . Highest education level: Not on file  Occupational History  . Not on file  Social Needs  . Financial resource strain: Not on file  . Food insecurity    Worry: Not on file    Inability: Not on file  . Transportation needs    Medical: Not on file    Non-medical: Not on file  Tobacco Use  . Smoking status: Current Every Day Smoker    Packs/day: 0.25    Years: 20.00    Pack years: 5.00    Types: Cigarettes    Last attempt to quit: 04/02/2014    Years since quitting: 5.2  . Smokeless tobacco: Never Used  Substance and Sexual Activity  . Alcohol use: Yes    Comment: Social maybe 2-3 drinks per year.   . Drug use: No  . Sexual activity: Yes  Lifestyle  . Physical activity    Days per week: Not on file    Minutes per session: Not on file  . Stress: Not on file  Relationships  . Social Herbalist on phone: Not on file    Gets together: Not on file    Attends  religious service: Not on file    Active member of club or organization: Not on file    Attends meetings of clubs or organizations: Not on file    Relationship status: Not on file  . Intimate partner violence    Fear of current or ex partner: Not on file    Emotionally abused: Not on file    Physically abused: Not on file    Forced sexual activity: Not on file  Other Topics Concern  . Not on file  Social History Narrative  . Not on file     Family History  Problem Relation Age of Onset  . Heart disease Mother   . Dementia Mother   . Alcohol abuse Mother   . Heart disease Father        stent, pacemaker  . Heart disease Brother   . Diabetes Brother   . Hyperlipidemia Brother        bypass  . Diabetes Brother       Current Outpatient Medications on File Prior to Visit  Medication Sig Dispense Refill  . aspirin EC 81 MG tablet Take 1 tablet (81 mg total) by mouth daily. 30 tablet 3  . MAGNESIUM PO Take 400 mg/day by mouth daily as needed. When feels tired & sluggish     . nicotine polacrilex (NICOTINE MINI) 2 MG lozenge Take 1 lozenge (2 mg total) by mouth as needed for smoking cessation. 30 tablet 2  . nitroGLYCERIN (NITROSTAT) 0.4 MG SL tablet Place 1 tablet (0.4 mg total) under the tongue every 5 (five) minutes as needed for chest pain. 30 tablet 3  . verapamil (CALAN-SR) 120 MG CR tablet TAKE 1 TABLET BY MOUTH IN THE EVENING AFTER DINNER 90 tablet 2   No current facility-administered medications on file prior to visit.     Cardiovascular studies:  EKG 05/08/2019: Sinus rhythm 64 bpm. Normal EKG.   Event monitor 09/19/15-10/18/15: Sinus rhythm. PACs, PVCs. 2 short runs of PSVT consisting of 4 beats each.  Echocardiogram 05/24/2016: 1. Left ventricle cavity is normal in size. Normal global wall motion. Calculated EF 56%. 2. Mild (Grade I) mitral regurgitation. Mild calcification of  the mitral valve annulus. 3. Trace tricuspid regurgitation.  Treadmill stress test [10/07/2015]:  Indication: Chest pain The patient exercised according to Bruce Protocol, Total time recorded 7:35 min achieving max heart rate of 169 which was 97% of MPHR for age and 9.49 METS of work. Normal BP response. Resting ECG showing NSR IRBBB. There was no ST-T changes of ischemia with exercise stress test. No arrythmias noted. Stress terminated due to shortness of breath and THR >85% MPHR met.   Recent labs: Not available.    Review of Systems  Constitution: Negative for decreased appetite, malaise/fatigue, weight gain and weight loss.  HENT: Negative for congestion.   Eyes: Negative for visual disturbance.  Cardiovascular: Positive for chest pain and palpitations. Negative for dyspnea on exertion, leg  swelling and syncope.  Respiratory: Negative for cough.   Endocrine: Negative for cold intolerance.  Hematologic/Lymphatic: Does not bruise/bleed easily.  Skin: Negative for itching and rash.  Musculoskeletal: Negative for myalgias.  Gastrointestinal: Negative for abdominal pain, nausea and vomiting.  Genitourinary: Negative for dysuria.  Neurological: Negative for dizziness and weakness.  Psychiatric/Behavioral: The patient is nervous/anxious.   All other systems reviewed and are negative.       No vitals available.  Objective:   Physical Exam   Not performed. Telephone visit.  Assessment & Recommendations:   53 year old female with symptomatic PVCs, PSVT, tobacco abuse, now with chest pain  Chest pain: Triggered by emotional stress.  In light of improvement in symptoms with medical therapy, will hold off stress testing at this time.  Continue aspirin 81 mg daily.  Check lipid panel.  Recommend Aspirin 81 mg unless stress test is normal. Will check lipid panel.  Follow-up lipids 3 months office visit.  Palpitations:  No significant arrhythmias noted.  Continue verapamil 120 mg daily.    Nigel Mormon, MD [facility] [place] Pager: [phone] Office: [phone] If no answer Cell [phone]

## 2019-09-21 ENCOUNTER — Ambulatory Visit: Payer: Self-pay | Admitting: Cardiology

## 2019-10-29 ENCOUNTER — Other Ambulatory Visit: Payer: Self-pay | Admitting: Cardiology

## 2019-12-28 ENCOUNTER — Ambulatory Visit (INDEPENDENT_AMBULATORY_CARE_PROVIDER_SITE_OTHER): Payer: Self-pay | Admitting: Cardiology

## 2019-12-28 ENCOUNTER — Encounter: Payer: Self-pay | Admitting: Cardiology

## 2019-12-28 ENCOUNTER — Other Ambulatory Visit: Payer: Self-pay

## 2019-12-28 VITALS — BP 106/52 | HR 88 | Resp 17 | Ht 63.0 in | Wt 128.0 lb

## 2019-12-28 DIAGNOSIS — R079 Chest pain, unspecified: Secondary | ICD-10-CM

## 2019-12-28 DIAGNOSIS — I471 Supraventricular tachycardia: Secondary | ICD-10-CM

## 2019-12-28 MED ORDER — VERAPAMIL HCL ER 120 MG PO TBCR: 120.0000 mg | EXTENDED_RELEASE_TABLET | Freq: Every day | ORAL | 3 refills | Status: DC

## 2019-12-28 MED ORDER — NITROGLYCERIN 0.4 MG SL SUBL: 0.4000 mg | SUBLINGUAL_TABLET | SUBLINGUAL | 2 refills | Status: DC | PRN

## 2019-12-28 NOTE — Progress Notes (Signed)
Follow up visit  Subjective:   Veronica Rodriguez, female    DOB: 02/14/1966, 54 y.o.   MRN: [id number]   Chief Complaint  Patient presents with  . Chest Pain  . Follow-up    3 month    HPI  54 year old female with symptomatic PVCs, PSVT, tobacco abuse, chest pain.  She diud not undergo stress test back in 05/2019 due to COVID restrictions. That said, her chest pain has improved. She has only had take NTG twice in last 6 months. There have been a lot of family related stressors. In spite, she has been able to stay active and does not have any exertional chest pain at this time.   Current Outpatient Medications on File Prior to Visit  Medication Sig Dispense Refill  . aspirin EC 81 MG tablet Take 1 tablet (81 mg total) by mouth daily. 30 tablet 3  . MAGNESIUM PO Take 400 mg/day by mouth daily. When feels tired & sluggish     . nitroGLYCERIN (NITROSTAT) 0.4 MG SL tablet Place 1 tablet (0.4 mg total) under the tongue every 5 (five) minutes as needed for chest pain. 30 tablet 3  . verapamil (CALAN-SR) 120 MG CR tablet TAKE 1 TABLET BY MOUTH IN THE EVENING AFTER DINNER 90 tablet 2   No current facility-administered medications on file prior to visit.    Cardiovascular studies:  EKG 05/08/2019: Sinus rhythm 64 bpm. Normal EKG.   Event monitor 09/19/15-10/18/15: Sinus rhythm. PACs, PVCs. 2 short runs of PSVT consisting of 4 beats each.  Echocardiogram 05/24/2016: 1. Left ventricle cavity is normal in size. Normal global wall motion. Calculated EF 56%. 2. Mild (Grade I) mitral regurgitation. Mild calcification of  the mitral valve annulus. 3. Trace tricuspid regurgitation.  Treadmill stress test [10/07/2015]:  Indication: Chest pain The patient exercised according to Bruce Protocol, Total time recorded 7:35 min achieving max heart rate of 169 which was 97% of MPHR for age and 9.49 METS of work. Normal BP response. Resting ECG showing NSR IRBBB. There was no ST-T changes of  ischemia with exercise stress test. No arrythmias noted. Stress terminated due to shortness of breath and THR >85% MPHR met.   Recent labs: Not available.    Review of Systems  Constitutional: Negative for decreased appetite, malaise/fatigue, weight gain and weight loss.  HENT: Negative for congestion.   Eyes: Negative for visual disturbance.  Cardiovascular: Positive for chest pain and palpitations. Negative for dyspnea on exertion, leg swelling and syncope.  Respiratory: Negative for cough.   Endocrine: Negative for cold intolerance.  Hematologic/Lymphatic: Does not bruise/bleed easily.  Skin: Negative for itching and rash.  Musculoskeletal: Negative for myalgias.  Gastrointestinal: Negative for abdominal pain, nausea and vomiting.  Genitourinary: Negative for dysuria.  Neurological: Negative for dizziness and weakness.  Psychiatric/Behavioral: The patient is nervous/anxious.   All other systems reviewed and are negative.       Today's Vitals   12/28/19 1136  BP: (!) 106/52  Pulse: 88  Resp: 17  SpO2: 97%  Weight: 128 lb (58.1 kg)  Height: 5' 3"  (1.6 m)   Body mass index is 22.67 kg/m.   Objective:   Physical Exam Vitals and nursing note reviewed.  Constitutional:      General: She is not in acute distress. Neck:     Vascular: No JVD.  Cardiovascular:     Rate and Rhythm: Normal rate and regular rhythm.     Pulses: Intact distal pulses.     Heart  sounds: Normal heart sounds. No murmur heard.   Pulmonary:     Effort: Pulmonary effort is normal.     Breath sounds: Normal breath sounds. No wheezing or rales.        Assessment & Recommendations:   54 year old female with symptomatic PVCs, PSVT, tobacco abuse, chest pain  Chest pain: Occasional NTG use. Unlikely angina.Okay to stop Aspirin. She does not want to check lipid panel at this time.  PSVT: Well controlled. Refilled verapamil 120 mg daily.  F/u in 1 year  Nigel Mormon, MD [facility]  [facility]. [place] Pager: [phone] Office: [phone] If no answer Cell [phone]

## 2020-12-29 ENCOUNTER — Telehealth: Payer: Self-pay | Admitting: Cardiology

## 2020-12-29 ENCOUNTER — Other Ambulatory Visit: Payer: Self-pay

## 2021-01-06 NOTE — Progress Notes (Signed)
Follow up visit  Subjective:   Veronica Rodriguez, female    DOB: 11/30/1965, 55 y.o.   MRN: [id number]   Chief Complaint  Patient presents with   Follow-up    1 YEAR   PSVT    HPI  55 year old female with symptomatic PVCs, PSVT, tobacco abuse, chest pain.  Patient is doing well. She has only occasional episodes of palpitations, when she missed her medication. She has not had any chest pain lately, but would like to have nitroglycerin prescription for as needed use. Her last episode was when she lost her mother in 2021.   Today, she asks me if ventricular tachycardia runs in families.  Her father, 24, has ventricular tachycardia diagnosis on his chart.  Current Outpatient Medications on File Prior to Visit  Medication Sig Dispense Refill   MAGNESIUM PO Take 400 mg/day by mouth daily. When feels tired & sluggish      nitroGLYCERIN (NITROSTAT) 0.4 MG SL tablet Place 1 tablet (0.4 mg total) under the tongue every 5 (five) minutes as needed for chest pain. 30 tablet 2   verapamil (CALAN-SR) 120 MG CR tablet Take 1 tablet (120 mg total) by mouth daily. 90 tablet 3   No current facility-administered medications on file prior to visit.    Cardiovascular studies:  EKG 01/10/2021: Sinus rhythm 68 bpm Normal EKG  Event monitor 09/19/15-10/18/15: Sinus rhythm. PACs, PVCs. 2 short runs of PSVT consisting of 4 beats each.   Echocardiogram 05/24/2016: 1. Left ventricle cavity is normal in size. Normal global wall motion. Calculated EF 56%. 2. Mild (Grade I) mitral regurgitation. Mild calcification of  the mitral valve annulus. 3. Trace tricuspid regurgitation.   Treadmill stress test  [10/07/2015]:  Indication: Chest pain The patient exercised according to Bruce Protocol, Total time recorded 7:35 min achieving max heart rate of 169 which was 97% of MPHR for age and 9.49 METS of work. Normal BP response. Resting ECG showing NSR IRBBB. There was no ST-T changes of ischemia with exercise  stress test. No arrythmias noted. Stress terminated due to shortness of breath and THR >85% MPHR met.    Recent labs: Not available.    Review of Systems  Cardiovascular:  Positive for palpitations (Only occasional). Negative for chest pain, dyspnea on exertion, leg swelling and syncope.       Today's Vitals   01/10/21 0910  BP: 116/71  Pulse: 78  Resp: 17  Temp: 98.1 F (36.7 C)  TempSrc: Temporal  SpO2: 96%  Weight: 131 lb 9.6 oz (59.7 kg)  Height: 5' 3"  (1.6 m)   Body mass index is 23.31 kg/m.   Objective:   Physical Exam Vitals and nursing note reviewed.  Constitutional:      General: She is not in acute distress. Cardiovascular:     Rate and Rhythm: Normal rate and regular rhythm.     Pulses: Normal pulses and intact distal pulses.     Heart sounds: Normal heart sounds. No murmur heard. Pulmonary:     Effort: Pulmonary effort is normal.     Breath sounds: Normal breath sounds. No wheezing or rales.  Musculoskeletal:     Right lower leg: No edema.     Left lower leg: No edema.  Neurological:     Mental Status: She is alert.       Assessment & Recommendations:   55 year old female with symptomatic PVCs, PSVT, tobacco abuse, chest pain  Chest pain: No recent recurrence.  Patient would like to have  nitroglycerin prescription available for as needed use.  PSVT: Well controlled. Refilled verapamil 120 mg daily.   To the patient that ventricular tachycardia may or may not have any head injury component.  Relatively small percentage of ventricular tachycardias, related to either channel apathies or structural cardiac abnormality that could run in the family.  I asked the patient to find out more details about her father's ventricular tachycardia.  F/u in 1 year  Nigel Mormon, MD [facility] [place] Pager: [phone] Office: [phone] If no answer Cell [phone]

## 2021-01-10 ENCOUNTER — Encounter: Payer: Self-pay | Admitting: Cardiology

## 2021-01-10 ENCOUNTER — Other Ambulatory Visit: Payer: Self-pay

## 2021-01-10 ENCOUNTER — Ambulatory Visit: Payer: Self-pay | Admitting: Cardiology

## 2021-01-10 VITALS — BP 116/71 | HR 78 | Temp 98.1°F | Resp 17 | Ht 63.0 in | Wt 131.6 lb

## 2021-01-10 DIAGNOSIS — I340 Nonrheumatic mitral (valve) insufficiency: Secondary | ICD-10-CM

## 2021-01-10 DIAGNOSIS — I471 Supraventricular tachycardia: Secondary | ICD-10-CM

## 2021-01-10 DIAGNOSIS — R079 Chest pain, unspecified: Secondary | ICD-10-CM

## 2021-01-10 MED ORDER — VERAPAMIL HCL ER 120 MG PO TBCR: 120.0000 mg | EXTENDED_RELEASE_TABLET | Freq: Every day | ORAL | 3 refills | Status: DC

## 2021-01-10 MED ORDER — NITROGLYCERIN 0.4 MG SL SUBL: 0.4000 mg | SUBLINGUAL_TABLET | SUBLINGUAL | 2 refills | Status: DC | PRN

## 2021-04-05 ENCOUNTER — Other Ambulatory Visit: Payer: Self-pay

## 2022-01-10 ENCOUNTER — Ambulatory Visit: Payer: Self-pay | Admitting: Cardiology

## 2022-01-10 ENCOUNTER — Encounter: Payer: Self-pay | Admitting: Cardiology

## 2022-01-10 VITALS — BP 124/61 | HR 86 | Temp 97.8°F | Resp 16 | Ht 63.0 in | Wt 131.0 lb

## 2022-01-10 DIAGNOSIS — I471 Supraventricular tachycardia: Secondary | ICD-10-CM

## 2022-01-10 MED ORDER — VERAPAMIL HCL ER 120 MG PO TBCR: 120.0000 mg | EXTENDED_RELEASE_TABLET | Freq: Every day | ORAL | 3 refills | Status: DC

## 2022-01-10 NOTE — Progress Notes (Signed)
Follow up visit  Subjective:   Veronica Rodriguez, female    DOB: October 08, 1965, 56 y.o.   MRN: [id number]   Chief Complaint  Patient presents with   PSVT   Follow-up    1 year    HPI  55 year old female with symptomatic PVCs, PSVT  Patient has had a lot of stress lately. This has coincided with episodes of "gasping air" lasting for a few seconds both during day and night. She denies any orthopnea, PND, leg edema symptoms. Palpitations are occasional. Denies any chest pain.      Current Outpatient Medications:    MAGNESIUM PO, Take 400 mg/day by mouth daily. When feels tired & sluggish , Disp: , Rfl:    nitroGLYCERIN (NITROSTAT) 0.4 MG SL tablet, Place 1 tablet (0.4 mg total) under the tongue every 5 (five) minutes as needed for chest pain., Disp: 30 tablet, Rfl: 2   verapamil (CALAN-SR) 120 MG CR tablet, Take 1 tablet (120 mg total) by mouth daily., Disp: 90 tablet, Rfl: 3  Cardiovascular studies:  EKG 01/10/2022: Sinus rhythm 74 bpm Normal EKG  Event monitor 09/19/15-10/18/15: Sinus rhythm. PACs, PVCs. 2 short runs of PSVT consisting of 4 beats each.   Echocardiogram 05/24/2016: 1. Left ventricle cavity is normal in size. Normal global wall motion. Calculated EF 56%. 2. Mild (Grade I) mitral regurgitation. Mild calcification of  the mitral valve annulus. 3. Trace tricuspid regurgitation.   Treadmill stress test  [10/07/2015]:  Indication: Chest pain The patient exercised according to Bruce Protocol, Total time recorded 7:35 min achieving max heart rate of 169 which was 97% of MPHR for age and 9.49 METS of work. Normal BP response. Resting ECG showing NSR IRBBB. There was no ST-T changes of ischemia with exercise stress test. No arrythmias noted. Stress terminated due to shortness of breath and THR >85% MPHR met.    Recent labs: Not available.    Review of Systems  Cardiovascular:  Positive for palpitations (Only occasional). Negative for chest pain, dyspnea on exertion,  leg swelling and syncope.  Respiratory:  Positive for shortness of breath.         Today's Vitals   01/10/22 1331  BP: 124/61  Pulse: 86  Resp: 16  Temp: 97.8 F (36.6 C)  TempSrc: Temporal  SpO2: 97%  Weight: 131 lb (59.4 kg)  Height: $Remove'5\' 3"'DPhSAxT$  (1.6 m)   Body mass index is 23.21 kg/m.   Objective:   Physical Exam Vitals and nursing note reviewed.  Constitutional:      General: She is not in acute distress. Cardiovascular:     Rate and Rhythm: Normal rate and regular rhythm.     Pulses: Normal pulses and intact distal pulses.     Heart sounds: Normal heart sounds. No murmur heard. Pulmonary:     Effort: Pulmonary effort is normal.     Breath sounds: Normal breath sounds. No wheezing or rales.  Musculoskeletal:     Right lower leg: No edema.     Left lower leg: No edema.  Neurological:     Mental Status: She is alert.        Assessment & Recommendations:    56 year old female with symptomatic PVCs, PSVT  PSVT, PVC: Continue verapamil 120 mg daily.  Current episodes of "gasping air" are very short lasting to be true PND/ Physical exam, EKG are unremarkable. Monitor for now. Recommend stress management.  F/u in 3 months. If no improvement, will consider echoardiogram and monitor.  Nigel Mormon, MD Pager: [phone] Office: [phone]

## 2022-04-11 ENCOUNTER — Ambulatory Visit: Payer: Self-pay | Admitting: Cardiology

## 2022-12-13 ENCOUNTER — Other Ambulatory Visit: Payer: Self-pay

## 2022-12-13 DIAGNOSIS — R079 Chest pain, unspecified: Secondary | ICD-10-CM

## 2022-12-13 MED ORDER — NITROGLYCERIN 0.4 MG SL SUBL: 0.4000 mg | SUBLINGUAL_TABLET | SUBLINGUAL | 2 refills | Status: AC | PRN

## 2023-01-02 NOTE — Progress Notes (Unsigned)
   Follow up visit  Subjective:   Veronica Rodriguez, female    DOB: 17-Nov-1965, 57 y.o.   MRN: [id number]   No chief complaint on file.   HPI  57 year old female with symptomatic PVCs, PSVT  Patient is doing well, has not had any recent palpitations symptoms.    Current Outpatient Medications:    MAGNESIUM PO, Take 400 mg/day by mouth daily. When feels tired & sluggish , Disp: , Rfl:    nitroGLYCERIN (NITROSTAT) 0.4 MG SL tablet, Place 1 tablet (0.4 mg total) under the tongue every 5 (five) minutes as needed for chest pain., Disp: 30 tablet, Rfl: 2   verapamil (CALAN-SR) 120 MG CR tablet, Take 1 tablet (120 mg total) by mouth daily., Disp: 90 tablet, Rfl: 3  Cardiovascular studies:  EKG 01/03/2023: Sinus rhythm 81 bpm Normal EKG  Event monitor 09/19/15-10/18/15: Sinus rhythm. PACs, PVCs. 2 short runs of PSVT consisting of 4 beats each.   Echocardiogram 05/24/2016: 1. Left ventricle cavity is normal in size. Normal global wall motion. Calculated EF 56%. 2. Mild (Grade I) mitral regurgitation. Mild calcification of  the mitral valve annulus. 3. Trace tricuspid regurgitation.   Treadmill stress test  [10/07/2015]:  Indication: Chest pain The patient exercised according to Bruce Protocol, Total time recorded 7:35 min achieving max heart rate of 169 which was 97% of MPHR for age and 9.49 METS of work. Normal BP response. Resting ECG showing NSR IRBBB. There was no ST-T changes of ischemia with exercise stress test. No arrythmias noted. Stress terminated due to shortness of breath and THR >85% MPHR met.    Recent labs: Not available.    Review of Systems  Cardiovascular:  Positive for palpitations (Only occasional). Negative for chest pain, dyspnea on exertion, leg swelling and syncope.  Respiratory:  Positive for shortness of breath.         Today's Vitals   01/03/23 0829  BP: 112/68  Pulse: 91  SpO2: 98%  Weight: 128 lb (58.1 kg)  Height: 5\' 3"  (1.6 m)   Body mass  index is 22.67 kg/m.   Objective:   Physical Exam Vitals and nursing note reviewed.  Constitutional:      General: She is not in acute distress. Cardiovascular:     Rate and Rhythm: Normal rate and regular rhythm.     Pulses: Normal pulses and intact distal pulses.     Heart sounds: Normal heart sounds. No murmur heard. Pulmonary:     Effort: Pulmonary effort is normal.     Breath sounds: Normal breath sounds. No wheezing or rales.  Musculoskeletal:     Right lower leg: No edema.     Left lower leg: No edema.  Neurological:     Mental Status: She is alert.        Assessment & Recommendations:    57 year old female with symptomatic PVCs, PSVT  PSVT, PVC: Doing well. Continue verapamil 120 mg daily.  F/u in 1 year    Elder Negus, MD Pager: [phone] Office: [phone]

## 2023-01-03 ENCOUNTER — Encounter: Payer: Self-pay | Admitting: Cardiology

## 2023-01-03 ENCOUNTER — Ambulatory Visit: Payer: Self-pay | Admitting: Cardiology

## 2023-01-03 VITALS — BP 112/68 | HR 91 | Ht 63.0 in | Wt 128.0 lb

## 2023-01-03 DIAGNOSIS — I493 Ventricular premature depolarization: Secondary | ICD-10-CM

## 2023-01-03 DIAGNOSIS — I471 Supraventricular tachycardia, unspecified: Secondary | ICD-10-CM

## 2023-02-15 ENCOUNTER — Other Ambulatory Visit: Payer: Self-pay | Admitting: Cardiology

## 2023-02-15 DIAGNOSIS — I471 Supraventricular tachycardia, unspecified: Secondary | ICD-10-CM

## 2024-01-03 ENCOUNTER — Ambulatory Visit: Payer: Self-pay | Admitting: Cardiology
